# Patient Record
Sex: Female | Born: 1970 | Race: White | Hispanic: No | Smoking: Former smoker
Health system: Southern US, Community
[De-identification: ages and names within clinical notes are randomized; demographics above are authoritative.]

## PROBLEM LIST (undated history)

## (undated) DIAGNOSIS — I959 Hypotension, unspecified: Secondary | ICD-10-CM

## (undated) DIAGNOSIS — J189 Pneumonia, unspecified organism: Secondary | ICD-10-CM

## (undated) DIAGNOSIS — F32A Depression, unspecified: Secondary | ICD-10-CM

## (undated) DIAGNOSIS — F329 Major depressive disorder, single episode, unspecified: Secondary | ICD-10-CM

## (undated) DIAGNOSIS — F4310 Post-traumatic stress disorder, unspecified: Secondary | ICD-10-CM

## (undated) DIAGNOSIS — I2699 Other pulmonary embolism without acute cor pulmonale: Secondary | ICD-10-CM

## (undated) DIAGNOSIS — F419 Anxiety disorder, unspecified: Secondary | ICD-10-CM

## (undated) DIAGNOSIS — E039 Hypothyroidism, unspecified: Secondary | ICD-10-CM

## (undated) DIAGNOSIS — A0472 Enterocolitis due to Clostridium difficile, not specified as recurrent: Secondary | ICD-10-CM

## (undated) DIAGNOSIS — M1990 Unspecified osteoarthritis, unspecified site: Secondary | ICD-10-CM

## (undated) DIAGNOSIS — I219 Acute myocardial infarction, unspecified: Secondary | ICD-10-CM

## (undated) HISTORY — DX: Depression, unspecified: F32.A

## (undated) HISTORY — DX: Unspecified osteoarthritis, unspecified site: M19.90

## (undated) HISTORY — PX: NASAL SINUS SURGERY: SHX719

## (undated) HISTORY — PX: CARPAL TUNNEL RELEASE: SHX101

## (undated) HISTORY — DX: Anxiety disorder, unspecified: F41.9

## (undated) HISTORY — PX: TUBAL LIGATION: SHX77

## (undated) HISTORY — PX: WISDOM TOOTH EXTRACTION: SHX21

## (undated) HISTORY — DX: Major depressive disorder, single episode, unspecified: F32.9

---

## 2012-08-18 ENCOUNTER — Ambulatory Visit (INDEPENDENT_AMBULATORY_CARE_PROVIDER_SITE_OTHER): Payer: PRIVATE HEALTH INSURANCE | Admitting: Psychiatry

## 2012-08-18 ENCOUNTER — Encounter (HOSPITAL_COMMUNITY): Payer: Self-pay | Admitting: Psychiatry

## 2012-08-18 VITALS — BP 116/76 | HR 92 | Ht 63.0 in | Wt 186.0 lb

## 2012-08-18 DIAGNOSIS — F3110 Bipolar disorder, current episode manic without psychotic features, unspecified: Secondary | ICD-10-CM

## 2012-08-18 DIAGNOSIS — F319 Bipolar disorder, unspecified: Secondary | ICD-10-CM

## 2012-08-18 MED ORDER — SERTRALINE HCL 25 MG PO TABS: ORAL_TABLET | ORAL | Status: DC
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
## 2012-08-22 DIAGNOSIS — F3110 Bipolar disorder, current episode manic without psychotic features, unspecified: Secondary | ICD-10-CM | POA: Insufficient documentation

## 2012-08-24 ENCOUNTER — Telehealth (HOSPITAL_COMMUNITY): Payer: Self-pay | Admitting: Psychiatry

## 2012-08-24 NOTE — Telephone Encounter (Signed)
Patient filled sertraline. Called and left message as patient had not filled clonazepam or seroquel from this pharmacy.

## 2012-09-15 ENCOUNTER — Ambulatory Visit (HOSPITAL_COMMUNITY): Payer: PRIVATE HEALTH INSURANCE | Admitting: Psychiatry

## 2014-02-20 ENCOUNTER — Ambulatory Visit (INDEPENDENT_AMBULATORY_CARE_PROVIDER_SITE_OTHER): Payer: Self-pay | Admitting: Emergency Medicine

## 2014-02-20 ENCOUNTER — Ambulatory Visit (INDEPENDENT_AMBULATORY_CARE_PROVIDER_SITE_OTHER): Payer: Self-pay

## 2014-02-20 VITALS — BP 100/76 | HR 116 | Temp 97.7°F | Resp 18 | Ht 63.0 in | Wt 191.6 lb

## 2014-02-20 DIAGNOSIS — S8000XA Contusion of unspecified knee, initial encounter: Secondary | ICD-10-CM

## 2014-02-20 DIAGNOSIS — M25561 Pain in right knee: Secondary | ICD-10-CM

## 2014-02-20 DIAGNOSIS — M25569 Pain in unspecified knee: Secondary | ICD-10-CM

## 2014-02-20 DIAGNOSIS — S8001XA Contusion of right knee, initial encounter: Secondary | ICD-10-CM

## 2014-02-20 NOTE — Patient Instructions (Signed)
Knee, Cartilage and its Function The menisci are made of tough cartilage, and fit between the surfaces of the bones upon which they rest. The menisci are semi lunar (C-shaped) and have a wedged profile. The wedged profile helps the stability of the joint by keeping the rounded femur surface from sliding off the flat tibial surface. The menisci are nourished (fed) by small blood vessels; but there is also a large area in the center of the meniscus that does not have a good blood supply (avascular). This presents a problem when there is an injury to the meniscus, as areas without good blood supply heal poorly. When there is a torn cartilage in the knee, surgery is often required to fix this. This is usually done with an arthroscopy (a surgical procedure less invasive than open surgery). Some times open surgery of the knee is required if there is other damage which has occurred. The medial meniscus rests on the medial tibial plateau. The tibia is the large bone in your lower leg (the shin bone). The medial tibial plateau is the upper end of the bone making up the inner part of your knee. The lateral meniscus serves the same purpose and is located on the outside of the knee. The menisci help to distribute your body weight across the knee joint. Without the meniscus present, the weight of your body would be unevenly applied to the bones in your legs (the femur and tibia). The femur is the large bone in your thigh. This uneven weight distribution would cause increased wear and tear on the cartilage covering the bones, leading to early damage of these areas. The presence of the menisci cartilage is necessary for a healthy knee. PURPOSE OF THE KNEE CARTILAGE The knee joint is made up of three bones: the thigh bone (femur), the shin bone (tibia), and the knee cap (patella). The surfaces of these bones at the knee joint are covered with cartilage. This smooth, slippery surface allows the bones to slide against each other  without causing bone damage. The meniscus sits between these cartilaginous surfaces of the bones (femur and tibia). It distributes the weight evenly in the joints and helps with the stability of the joint (keeps the joint steady). HOME CARE INSTRUCTIONS After surgery:  Use crutches as instructed.  Once home, an ice pack applied to your injury may help with discomfort and keep the swelling down. An ice pack can be used for 15-20 minutes 03-04 times per day for the first 2-3 days or as instructed.  Only take over-the-counter or prescription medicines for pain, discomfort, or fever as directed by your caregiver.  Call if you do not have relief of pain with medications or if there is an increase in pain.  Call if your foot becomes cold or blue.  Call if your knee gets stuck in a fixed position and will not move.  You may resume normal diet and activities as directed.  Make sure to keep your appointment with your follow-up caregiver. This injury may require further evaluation and treatment beyond the treatment you received today. MAKE SURE YOU:   Understand these instructions.  Will watch your condition.  Will get help right away if you are not doing well or get worse. Document Released: 02/08/2002 Document Revised: 11/11/2011 Document Reviewed: 06/21/2009 ExitCare Patient Information 2015 [employer]. This information is not intended to replace advice given to you by your health care Krithi Bray. Make sure you discuss any questions you have with your health care Dane Bloch.  
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
## 2014-04-24 ENCOUNTER — Ambulatory Visit (INDEPENDENT_AMBULATORY_CARE_PROVIDER_SITE_OTHER): Payer: Self-pay | Admitting: Family Medicine

## 2014-04-24 VITALS — BP 100/78 | HR 90 | Temp 97.7°F | Resp 16 | Ht 63.0 in | Wt 186.2 lb

## 2014-04-24 DIAGNOSIS — L600 Ingrowing nail: Secondary | ICD-10-CM

## 2014-04-24 MED ORDER — FLUCONAZOLE 150 MG PO TABS: 150.0000 mg | ORAL_TABLET | Freq: Once | ORAL | Status: DC

## 2014-04-24 MED ORDER — CEPHALEXIN 500 MG PO CAPS: 500.0000 mg | ORAL_CAPSULE | Freq: Three times a day (TID) | ORAL | Status: DC
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
## 2014-04-24 NOTE — Patient Instructions (Signed)
You are on the right track with your toenail- continue to soak it at night, and pull the skin back from the edge to help it grow up and free (and not down into the skin).  Use the antibiotic three times a day for one week, and the diflucan if needed for yeast infection. If your toe is not better in a week or so please come in and we can remove part of the nail if needed

## 2014-04-30 ENCOUNTER — Ambulatory Visit (INDEPENDENT_AMBULATORY_CARE_PROVIDER_SITE_OTHER): Payer: Self-pay | Admitting: Family Medicine

## 2014-04-30 VITALS — BP 132/80 | HR 87 | Temp 97.7°F | Resp 15 | Ht 63.0 in | Wt 184.6 lb

## 2014-04-30 DIAGNOSIS — M79609 Pain in unspecified limb: Secondary | ICD-10-CM

## 2014-04-30 DIAGNOSIS — L600 Ingrowing nail: Secondary | ICD-10-CM

## 2014-04-30 MED ORDER — TRAMADOL HCL 50 MG PO TABS
50.0000 mg | ORAL_TABLET | Freq: Four times a day (QID) | ORAL | Status: DC | PRN
Start: 2014-04-30 — End: 2020-12-03

## 2014-04-30 NOTE — Addendum Note (Signed)
Addended by: Johnnette Litter on: 04/30/2014 04:07 PM   Modules accepted: Orders

## 2014-04-30 NOTE — Progress Notes (Signed)
Procedure:  Consent obtained.  Digital block with lido and marcaine.  Local anesthesia obtained.  Nail edge lifted and then nail splitters used to remove the medial portion on the patient's right great toenail. Xeroform gauze placed and then a drsg.  Wound care d/w pt.

## 2014-04-30 NOTE — Patient Instructions (Signed)
INGROWN TOENAIL . Keep area clean, dry and bandaged for 24 hours. . After 24 hours, remove outer bandage and leave yellow gauze in place. . Soak toe/foot in warm soapy water for 5-10 minutes, once daily for 5 days. Rebandage toe after each cleaning. . Continue soaks until yellow gauze falls off. . Notify the office if you experience any of the following signs of infection: Swelling, redness, pus drainage, streaking, fever > 101.0 F   
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
## 2015-02-09 ENCOUNTER — Other Ambulatory Visit: Payer: Self-pay | Admitting: Family Medicine

## 2015-09-03 HISTORY — PX: KNEE ARTHROPLASTY: SHX992

## 2018-03-06 ENCOUNTER — Other Ambulatory Visit: Payer: Self-pay

## 2018-03-06 ENCOUNTER — Emergency Department (HOSPITAL_COMMUNITY): Payer: Medicaid Other

## 2018-03-06 ENCOUNTER — Encounter (HOSPITAL_COMMUNITY): Payer: Self-pay | Admitting: *Deleted

## 2018-03-06 ENCOUNTER — Observation Stay (HOSPITAL_BASED_OUTPATIENT_CLINIC_OR_DEPARTMENT_OTHER): Payer: Medicaid Other

## 2018-03-06 ENCOUNTER — Observation Stay (HOSPITAL_COMMUNITY)
Admission: EM | Admit: 2018-03-06 | Discharge: 2018-03-07 | Disposition: A | Discharge status: Home / Self Care | Payer: Medicaid Other | Attending: Internal Medicine | Admitting: Internal Medicine

## 2018-03-06 DIAGNOSIS — E039 Hypothyroidism, unspecified: Secondary | ICD-10-CM | POA: Diagnosis present

## 2018-03-06 DIAGNOSIS — I2510 Atherosclerotic heart disease of native coronary artery without angina pectoris: Secondary | ICD-10-CM | POA: Diagnosis not present

## 2018-03-06 DIAGNOSIS — Z87891 Personal history of nicotine dependence: Secondary | ICD-10-CM | POA: Diagnosis not present

## 2018-03-06 DIAGNOSIS — I340 Nonrheumatic mitral (valve) insufficiency: Secondary | ICD-10-CM

## 2018-03-06 DIAGNOSIS — R109 Unspecified abdominal pain: Secondary | ICD-10-CM | POA: Diagnosis present

## 2018-03-06 DIAGNOSIS — R079 Chest pain, unspecified: Principal | ICD-10-CM | POA: Insufficient documentation

## 2018-03-06 DIAGNOSIS — Z789 Other specified health status: Secondary | ICD-10-CM

## 2018-03-06 DIAGNOSIS — R1012 Left upper quadrant pain: Secondary | ICD-10-CM | POA: Diagnosis not present

## 2018-03-06 DIAGNOSIS — F418 Other specified anxiety disorders: Secondary | ICD-10-CM | POA: Diagnosis present

## 2018-03-06 DIAGNOSIS — F319 Bipolar disorder, unspecified: Secondary | ICD-10-CM | POA: Diagnosis not present

## 2018-03-06 DIAGNOSIS — Z79899 Other long term (current) drug therapy: Secondary | ICD-10-CM | POA: Insufficient documentation

## 2018-03-06 DIAGNOSIS — Z7289 Other problems related to lifestyle: Secondary | ICD-10-CM | POA: Diagnosis present

## 2018-03-06 DIAGNOSIS — F329 Major depressive disorder, single episode, unspecified: Secondary | ICD-10-CM | POA: Insufficient documentation

## 2018-03-06 DIAGNOSIS — F419 Anxiety disorder, unspecified: Secondary | ICD-10-CM | POA: Insufficient documentation

## 2018-03-06 DIAGNOSIS — E785 Hyperlipidemia, unspecified: Secondary | ICD-10-CM | POA: Diagnosis present

## 2018-03-06 DIAGNOSIS — Z7902 Long term (current) use of antithrombotics/antiplatelets: Secondary | ICD-10-CM | POA: Insufficient documentation

## 2018-03-06 DIAGNOSIS — E876 Hypokalemia: Secondary | ICD-10-CM | POA: Diagnosis present

## 2018-03-06 LAB — BASIC METABOLIC PANEL
ANION GAP: 14 (ref 5–15)
BUN: 9 mg/dL (ref 6–20)
CALCIUM: 9.3 mg/dL (ref 8.9–10.3)
CO2: 19 mmol/L — ABNORMAL LOW (ref 22–32)
Chloride: 106 mmol/L (ref 98–111)
Creatinine, Ser: 0.81 mg/dL (ref 0.44–1.00)
GFR calc Af Amer: >60 mL/min (ref >60)
GLUCOSE: 117 mg/dL — AB (ref 70–99)
Potassium: 3 mmol/L — ABNORMAL LOW (ref 3.5–5.1)
SODIUM: 139 mmol/L (ref 135–145)

## 2018-03-06 LAB — CBC
HCT: 38.8 % (ref 36.0–46.0)
HEMOGLOBIN: 12.5 g/dL (ref 12.0–15.0)
MCH: 29.8 pg (ref 26.0–34.0)
MCHC: 32.2 g/dL (ref 30.0–36.0)
MCV: 92.4 fL (ref 78.0–100.0)
Platelets: 348 10*3/uL (ref 150–400)
RBC: 4.2 MIL/uL (ref 3.87–5.11)
RDW: 12.9 % (ref 11.5–15.5)
WBC: 7.6 10*3/uL (ref 4.0–10.5)

## 2018-03-06 LAB — I-STAT BETA HCG BLOOD, ED (MC, WL, AP ONLY): I-stat hCG, quantitative: <5 m[IU]/mL (ref <5)

## 2018-03-06 LAB — TROPONIN I
Troponin I: <0.03 ng/mL (ref <0.03)
Troponin I: <0.03 ng/mL (ref <0.03)

## 2018-03-06 LAB — RAPID URINE DRUG SCREEN, HOSP PERFORMED
AMPHETAMINES: NOT DETECTED
Benzodiazepines: NOT DETECTED
Cocaine: NOT DETECTED
Opiates: POSITIVE — AB
TETRAHYDROCANNABINOL: NOT DETECTED

## 2018-03-06 LAB — HIV ANTIBODY (ROUTINE TESTING W REFLEX): HIV SCREEN 4TH GENERATION: NONREACTIVE

## 2018-03-06 LAB — TSH: TSH: 1.983 u[IU]/mL (ref 0.350–4.500)

## 2018-03-06 LAB — ECHOCARDIOGRAM COMPLETE
Height: 62 in
WEIGHTICAEL: 3014.4 [oz_av]

## 2018-03-06 LAB — I-STAT TROPONIN, ED: TROPONIN I, POC: 0 ng/mL (ref 0.00–0.08)

## 2018-03-06 LAB — LIPASE, BLOOD: Lipase: 42 U/L (ref 11–51)

## 2018-03-06 LAB — MAGNESIUM: Magnesium: 1.8 mg/dL (ref 1.7–2.4)

## 2018-03-06 MED ORDER — POTASSIUM CHLORIDE CRYS ER 20 MEQ PO TBCR
40.0000 meq | EXTENDED_RELEASE_TABLET | Freq: Once | ORAL | Status: AC
  Administered 2018-03-06: 40 meq via ORAL
  Filled 2018-03-06: qty 2

## 2018-03-06 MED ORDER — SODIUM CHLORIDE 0.9 % IV BOLUS
1000.0000 mL | Freq: Once | INTRAVENOUS | Status: AC
  Administered 2018-03-06: 1000 mL via INTRAVENOUS

## 2018-03-06 MED ORDER — LEVOTHYROXINE SODIUM 125 MCG PO TABS
125.0000 ug | ORAL_TABLET | Freq: Every day | ORAL | Status: DC
  Filled 2018-03-06 (×2): qty 1

## 2018-03-06 MED ORDER — QUETIAPINE FUMARATE 100 MG PO TABS
100.0000 mg | ORAL_TABLET | Freq: Every day | ORAL | Status: DC
  Administered 2018-03-06: 100 mg via ORAL
  Filled 2018-03-06: qty 2
  Filled 2018-03-06 (×2): qty 1

## 2018-03-06 MED ORDER — METAXALONE 800 MG PO TABS
800.0000 mg | ORAL_TABLET | Freq: Two times a day (BID) | ORAL | Status: DC
  Administered 2018-03-06 – 2018-03-07 (×3): 800 mg via ORAL
  Filled 2018-03-06 (×4): qty 1

## 2018-03-06 MED ORDER — MORPHINE SULFATE (PF) 4 MG/ML IV SOLN: 3.0000 mg | INTRAVENOUS | Status: DC | PRN

## 2018-03-06 MED ORDER — ALUM & MAG HYDROXIDE-SIMETH 200-200-20 MG/5ML PO SUSP: 30.0000 mL | Freq: Four times a day (QID) | ORAL | Status: DC | PRN

## 2018-03-06 MED ORDER — IOPAMIDOL (ISOVUE-370) INJECTION 76%
INTRAVENOUS | Status: AC
  Filled 2018-03-06: qty 100

## 2018-03-06 MED ORDER — ACETAMINOPHEN 325 MG PO TABS: 650.0000 mg | ORAL_TABLET | ORAL | Status: DC | PRN

## 2018-03-06 MED ORDER — LORAZEPAM 2 MG/ML IJ SOLN: 0.0000 mg | Freq: Four times a day (QID) | INTRAMUSCULAR | Status: DC

## 2018-03-06 MED ORDER — MORPHINE SULFATE (PF) 4 MG/ML IV SOLN
4.0000 mg | Freq: Once | INTRAVENOUS | Status: AC
  Administered 2018-03-06: 4 mg via INTRAVENOUS
  Filled 2018-03-06: qty 1

## 2018-03-06 MED ORDER — ENOXAPARIN SODIUM 40 MG/0.4ML ~~LOC~~ SOLN
40.0000 mg | SUBCUTANEOUS | Status: DC
  Administered 2018-03-06 – 2018-03-07 (×2): 40 mg via SUBCUTANEOUS
  Filled 2018-03-06 (×2): qty 0.4

## 2018-03-06 MED ORDER — CLOPIDOGREL BISULFATE 75 MG PO TABS
75.0000 mg | ORAL_TABLET | Freq: Every day | ORAL | Status: DC
  Administered 2018-03-06: 75 mg via ORAL
  Filled 2018-03-06 (×2): qty 1

## 2018-03-06 MED ORDER — LORAZEPAM 1 MG PO TABS
1.0000 mg | ORAL_TABLET | Freq: Four times a day (QID) | ORAL | Status: DC | PRN
Start: 2018-03-06 — End: 2018-03-07

## 2018-03-06 MED ORDER — ASPIRIN EC 81 MG PO TBEC
81.0000 mg | DELAYED_RELEASE_TABLET | Freq: Every day | ORAL | Status: DC
  Administered 2018-03-06 – 2018-03-07 (×2): 81 mg via ORAL
  Filled 2018-03-06 (×2): qty 1

## 2018-03-06 MED ORDER — NITROGLYCERIN 0.4 MG SL SUBL: 0.4000 mg | SUBLINGUAL_TABLET | SUBLINGUAL | Status: DC | PRN

## 2018-03-06 MED ORDER — LORAZEPAM 2 MG/ML IJ SOLN: 1.0000 mg | Freq: Four times a day (QID) | INTRAMUSCULAR | Status: DC | PRN

## 2018-03-06 MED ORDER — ONDANSETRON HCL 4 MG/2ML IJ SOLN: 4.0000 mg | Freq: Four times a day (QID) | INTRAMUSCULAR | Status: DC | PRN

## 2018-03-06 MED ORDER — IOPAMIDOL (ISOVUE-370) INJECTION 76%
100.0000 mL | Freq: Once | INTRAVENOUS | Status: AC | PRN
  Administered 2018-03-06: 100 mL via INTRAVENOUS

## 2018-03-06 MED ORDER — ASPIRIN EC 325 MG PO TBEC: 325.0000 mg | DELAYED_RELEASE_TABLET | Freq: Every day | ORAL | Status: DC

## 2018-03-06 MED ORDER — FENOFIBRATE 160 MG PO TABS
160.0000 mg | ORAL_TABLET | Freq: Every day | ORAL | Status: DC
  Administered 2018-03-06: 160 mg via ORAL
  Filled 2018-03-06 (×2): qty 1

## 2018-03-06 MED ORDER — THIAMINE HCL 100 MG/ML IJ SOLN: 100.0000 mg | Freq: Every day | INTRAMUSCULAR | Status: DC

## 2018-03-06 MED ORDER — LORAZEPAM 2 MG/ML IJ SOLN: 0.0000 mg | Freq: Two times a day (BID) | INTRAMUSCULAR | Status: DC

## 2018-03-06 MED ORDER — FAMOTIDINE IN NACL 20-0.9 MG/50ML-% IV SOLN
20.0000 mg | Freq: Two times a day (BID) | INTRAVENOUS | Status: DC
  Filled 2018-03-06: qty 50

## 2018-03-06 MED ORDER — PRAVASTATIN SODIUM 40 MG PO TABS
40.0000 mg | ORAL_TABLET | Freq: Every day | ORAL | Status: DC
  Administered 2018-03-06: 40 mg via ORAL
  Filled 2018-03-06 (×2): qty 1

## 2018-03-06 MED ORDER — ADULT MULTIVITAMIN W/MINERALS CH
1.0000 | ORAL_TABLET | Freq: Every day | ORAL | Status: DC
  Administered 2018-03-06 – 2018-03-07 (×2): 1 via ORAL
  Filled 2018-03-06 (×2): qty 1

## 2018-03-06 MED ORDER — SODIUM CHLORIDE 0.9 % IV SOLN
INTRAVENOUS | Status: DC
  Administered 2018-03-06: 125 mL/h via INTRAVENOUS

## 2018-03-06 MED ORDER — FOLIC ACID 1 MG PO TABS
1.0000 mg | ORAL_TABLET | Freq: Every day | ORAL | Status: DC
  Administered 2018-03-06 – 2018-03-07 (×2): 1 mg via ORAL
  Filled 2018-03-06 (×2): qty 1

## 2018-03-06 MED ORDER — CLONAZEPAM 0.5 MG PO TABS
1.0000 mg | ORAL_TABLET | Freq: Two times a day (BID) | ORAL | Status: DC | PRN
  Administered 2018-03-06 – 2018-03-07 (×3): 1 mg via ORAL
  Filled 2018-03-06 (×3): qty 2

## 2018-03-06 MED ORDER — ZOLPIDEM TARTRATE 5 MG PO TABS: 5.0000 mg | ORAL_TABLET | Freq: Every evening | ORAL | Status: DC | PRN

## 2018-03-06 MED ORDER — VITAMIN B-1 100 MG PO TABS
100.0000 mg | ORAL_TABLET | Freq: Every day | ORAL | Status: DC
  Administered 2018-03-06 – 2018-03-07 (×2): 100 mg via ORAL
  Filled 2018-03-06 (×2): qty 1

## 2018-03-06 MED ORDER — TRAMADOL HCL 50 MG PO TABS: 50.0000 mg | ORAL_TABLET | Freq: Four times a day (QID) | ORAL | Status: DC | PRN

## 2018-03-06 MED ORDER — PANTOPRAZOLE SODIUM 40 MG PO TBEC
40.0000 mg | DELAYED_RELEASE_TABLET | Freq: Every day | ORAL | Status: DC
  Administered 2018-03-06: 40 mg via ORAL
  Filled 2018-03-06: qty 1

## 2018-03-06 NOTE — ED Notes (Signed)
MD Niu at bedside  

## 2018-03-06 NOTE — ED Triage Notes (Signed)
From home, awoke with reported 8.5/10 chest pain  Took nitro prior to [facility] arrival, attempted to chew asprin PTA but reported "choking" and vomitting and throat pain since attempting to chew asprin. SOB, abdominal tenderness  5/10 CP on arrival 146/84 initally, took additional nitro,  HR 110, 97% on 2L Hx of MI in 12/2015; 1 stents placed  18 in LAC 150-200 CC bolus enroute  Alert oriented x4
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
## 2018-03-06 NOTE — Progress Notes (Signed)
PROGRESS NOTE    Melanie Dudley  ZHY:[id number] DOB: 02/17/1971 DOA: 03/06/2018 PCP: Patient, No Pcp Per      Brief Narrative:  Melanie Dudley is a 47 y.o. F with anxiety, history CAD with RCA STEMI in 2017, hypothyroidism, anxiety and bipolar, and alcohol use who presents with chest pain.  The patient was in her usual state of health until around 1 AM the night of admission when she was lying in bed, noticed difficulty breathing.  As she got up she noticed chest pain, so she called 9-1-1 who told her to take ASA, which caused her to vomit, after which she had severe chest pressure, dyspnea, and anxiety, and was transported to the ER.   There initial ECG and CXR normal. CTA negative. Troponin normal.     Assessment & Plan:  Chest pain HEART score 4. -Cycle enzymes -Obtain echo -Monitor on telemetry -Nuclear treadmill stress tomorrow -NPO in AM  Coroanry artery disease secondary prevention -Continue home aspirin, Plavix, fibrate, statin  Depression and anxiety -Continue home Klonopin, Seroquel  Hypothyroidism -Continue levothyroxine  Hypokalemia Potassium given -Repeat BMP tomorrow  Abdominal pain Lipase normal.  Likely gastritis from alcohol -Continue Protonix, Mylanta, Pepcid  Alcohol use -CIWA with lorazepam on demand -Cessation recommended    DVT prophylaxis: Lovenox Code Status: FULL Family Communication: Husband at bedside MDM and disposition Plan: This is a no charge note.  For further details, please see H&P by my partner Dr. Clyde LundborgNiu from earlier today.  The below labs and imaging reports were reviewed and summarized above.    The patient was admitted with chest pain.  High risk, moderately suspicious features, ECG unremarkable.  Cycles enzymes and stress tomorrow if normal.    Objective: Vitals:   03/06/18 0500 03/06/18 0530 03/06/18 0609 03/06/18 1450  BP: 104/80 104/71 101/72 105/73  Pulse: 81 75 75 74  Resp: 15 10    Temp:   98.1 F (36.7 C) 97.7 F (36.5  C)  TempSrc:   Oral Oral  SpO2: 98% 92% 100% 96%  Weight:   85.5 kg (188 lb 6.4 oz)   Height:   5\' 2"  (1.575 m)     Intake/Output Summary (Last 24 hours) at 03/06/2018 1701 Last data filed at 03/06/2018 0655 Gross per 24 hour  Intake 2538.47 ml  Output -  Net 2538.47 ml   Filed Weights   03/06/18 0157 03/06/18 0609  Weight: 81.6 kg (180 lb) 85.5 kg (188 lb 6.4 oz)    Examination: The patient was seen and examined.      Data Reviewed: I have personally reviewed following labs and imaging studies:  CBC: Recent Labs  Lab 03/06/18 0200  WBC 7.6  HGB 12.5  HCT 38.8  MCV 92.4  PLT 348   Basic Metabolic Panel: Recent Labs  Lab 03/06/18 0200 03/06/18 0523  NA 139  --   K 3.0*  --   CL 106  --   CO2 19*  --   GLUCOSE 117*  --   BUN 9  --   CREATININE 0.81  --   CALCIUM 9.3  --   MG  --  1.8   GFR: Estimated Creatinine Clearance: 85.3 mL/min (by C-G formula based on SCr of 0.81 mg/dL). Liver Function Tests: No results for input(s): AST, ALT, ALKPHOS, BILITOT, PROT, ALBUMIN in the last 168 hours. Recent Labs  Lab 03/06/18 0523  LIPASE 42   No results for input(s): AMMONIA in the last 168 hours. Coagulation Profile: No results  for input(s): INR, PROTIME in the last 168 hours. Cardiac Enzymes: Recent Labs  Lab 03/06/18 0523 03/06/18 1053  TROPONINI <0.03 <0.03   BNP (last 3 results) No results for input(s): PROBNP in the last 8760 hours. HbA1C: No results for input(s): HGBA1C in the last 72 hours. CBG: No results for input(s): GLUCAP in the last 168 hours. Lipid Profile: No results for input(s): CHOL, HDL, LDLCALC, TRIG, CHOLHDL, LDLDIRECT in the last 72 hours. Thyroid Function Tests: Recent Labs    03/06/18 1053  TSH 1.983   Anemia Panel: No results for input(s): VITAMINB12, FOLATE, FERRITIN, TIBC, IRON, RETICCTPCT in the last 72 hours. Urine analysis: No results found for: COLORURINE, APPEARANCEUR, LABSPEC, PHURINE, GLUCOSEU, HGBUR,  BILIRUBINUR, KETONESUR, PROTEINUR, UROBILINOGEN, NITRITE, LEUKOCYTESUR Sepsis Labs: @LABRCNTIP (procalcitonin:4,lacticacidven:4)  )No results found for this or any previous visit (from the past 240 hour(s)).       Radiology Studies: Dg Chest 2 View  Result Date: 03/06/2018 CLINICAL DATA:  Patient woke with chest pain. Checking, vomiting, and throat pain after attempting to chew on aspirin. Shortness of breath and abdominal tenderness. EXAM: CHEST - 2 VIEW COMPARISON:  None. FINDINGS: The heart size and mediastinal contours are within normal limits. Both lungs are clear. The visualized skeletal structures are unremarkable. IMPRESSION: No active cardiopulmonary disease. Electronically Signed   By: Burman Nieves M.D.   On: 03/06/2018 02:30   Ct Angio Chest/abd/pel For Dissection W And/or Wo Contrast  Result Date: 03/06/2018 CLINICAL DATA:  47 y/o F; chest pain, shortness of breath, abdominal tension. Evaluate for aortic dissection. EXAM: CT ANGIOGRAPHY CHEST, ABDOMEN AND PELVIS TECHNIQUE: Multidetector CT imaging through the chest, abdomen and pelvis was performed using the standard protocol during bolus administration of intravenous contrast. Multiplanar reconstructed images and MIPs were obtained and reviewed to evaluate the vascular anatomy. CONTRAST:  ISOVUE-370 IOPAMIDOL (ISOVUE-370) INJECTION 76% COMPARISON:  None. FINDINGS: CTA CHEST FINDINGS Cardiovascular: Normal heart size. No pericardial effusion. Moderate coronary artery calcification. Normal caliber thoracic aorta and main pulmonary artery. Mediastinum/Nodes: No enlarged mediastinal, hilar, or axillary lymph nodes. Thyroid gland, trachea, and esophagus demonstrate no significant findings. Lungs/Pleura: Lungs are clear. No pleural effusion or pneumothorax. Mild centrilobular emphysema of the lungs. Musculoskeletal: No chest wall abnormality. No acute or significant osseous findings. Review of the MIP images confirms the above  findings. CTA ABDOMEN AND PELVIS FINDINGS VASCULAR Aorta: Normal caliber aorta without aneurysm, dissection, vasculitis or significant stenosis. Moderate calcific atherosclerosis. Celiac: Patent without evidence of aneurysm, dissection, vasculitis or significant stenosis. SMA: Patent without evidence of aneurysm, dissection, vasculitis or significant stenosis. Renals: Both renal arteries are patent without evidence of aneurysm, dissection, vasculitis, fibromuscular dysplasia or significant stenosis. IMA: Patent without evidence of aneurysm, dissection, vasculitis or significant stenosis. Inflow: Patent without evidence of aneurysm, dissection, vasculitis or significant stenosis. Veins: No obvious venous abnormality within the limitations of this arterial phase study. Review of the MIP images confirms the above findings. NON-VASCULAR Hepatobiliary: No focal liver abnormality is seen. Cholelithiasis. No gallbladder wall thickening or biliary ductal dilatation. Pancreas: Unremarkable. No pancreatic ductal dilatation or surrounding inflammatory changes. Spleen: Normal in size without focal abnormality. Adrenals/Urinary Tract: Adrenal glands are unremarkable. Kidneys are normal, without renal calculi, focal lesion, or hydronephrosis. Bladder is unremarkable. Stomach/Bowel: Stomach is within normal limits. Appendix appears normal. No evidence of bowel wall thickening, distention, or inflammatory changes. Lymphatic: No significant vascular findings are present. No enlarged abdominal or pelvic lymph nodes. Reproductive: Uterus and bilateral adnexa are unremarkable. Other: No abdominal wall hernia  or abnormality. No abdominopelvic ascites. Musculoskeletal: No acute or significant osseous findings. Review of the MIP images confirms the above findings. IMPRESSION: 1. No acute vascular abnormality. No aortic dissection, stenosis, aneurysm, or findings of vasculitis. 2. Moderate calcific atherosclerosis of coronary arteries and  the aorta. 3. Mild centrilobular emphysema of the lungs. 4. Cholelithiasis.  No secondary signs of cholecystitis. Electronically Signed   By: Mitzi Hansen M.D.   On: 03/06/2018 04:22        Scheduled Meds: . aspirin EC  81 mg Oral Daily  . clopidogrel  75 mg Oral Daily  . enoxaparin (LOVENOX) injection  40 mg Subcutaneous Q24H  . fenofibrate  160 mg Oral Daily  . folic acid  1 mg Oral Daily  . levothyroxine  125 mcg Oral QAC breakfast  . LORazepam  0-4 mg Intravenous Q6H   Followed by  . [START ON 03/08/2018] LORazepam  0-4 mg Intravenous Q12H  . metaxalone  800 mg Oral BID  . multivitamin with minerals  1 tablet Oral Daily  . pantoprazole  40 mg Oral Q1200  . pravastatin  40 mg Oral Daily  . QUEtiapine  100 mg Oral QHS  . thiamine  100 mg Oral Daily   Or  . thiamine  100 mg Intravenous Daily   Continuous Infusions: . famotidine (PEPCID) IV       LOS: 0 days    Time spent: 25 minutes    Alberteen Sam, MD [facility] 03/06/2018, 5:01 PM     Pager [phone] --- please page though AMION:  [email or web address] Password [email or web address] If 7PM-7AM, please contact night-coverage
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
## 2018-03-06 NOTE — Progress Notes (Signed)
Echocardiogram 2D Echocardiogram has been performed.  Pieter PartridgeBrooke S Jannel Lynne 03/06/2018, 9:03 AM

## 2018-03-06 NOTE — ED Notes (Signed)
Report attempted. RN on floor unable to take call.

## 2018-03-06 NOTE — ED Notes (Signed)
recollect blood to run istat test.

## 2018-03-07 ENCOUNTER — Observation Stay (HOSPITAL_BASED_OUTPATIENT_CLINIC_OR_DEPARTMENT_OTHER): Payer: Medicaid Other

## 2018-03-07 DIAGNOSIS — R079 Chest pain, unspecified: Secondary | ICD-10-CM

## 2018-03-07 DIAGNOSIS — F419 Anxiety disorder, unspecified: Secondary | ICD-10-CM | POA: Diagnosis not present

## 2018-03-07 DIAGNOSIS — I25118 Atherosclerotic heart disease of native coronary artery with other forms of angina pectoris: Secondary | ICD-10-CM | POA: Diagnosis not present

## 2018-03-07 DIAGNOSIS — E785 Hyperlipidemia, unspecified: Secondary | ICD-10-CM

## 2018-03-07 DIAGNOSIS — F329 Major depressive disorder, single episode, unspecified: Secondary | ICD-10-CM | POA: Diagnosis not present

## 2018-03-07 DIAGNOSIS — F319 Bipolar disorder, unspecified: Secondary | ICD-10-CM | POA: Diagnosis not present

## 2018-03-07 LAB — HEMOGLOBIN A1C
HEMOGLOBIN A1C: 5.6 % (ref 4.8–5.6)
MEAN PLASMA GLUCOSE: 114.02 mg/dL

## 2018-03-07 LAB — LIPID PANEL
Cholesterol: 215 mg/dL — ABNORMAL HIGH (ref 0–200)
HDL: 35 mg/dL — AB (ref >40)
LDL Cholesterol: UNDETERMINED mg/dL (ref 0–99)
Total CHOL/HDL Ratio: 6.1 RATIO
Triglycerides: 547 mg/dL — ABNORMAL HIGH (ref <150)
VLDL: UNDETERMINED mg/dL (ref 0–40)

## 2018-03-07 LAB — NM MYOCAR MULTI W/SPECT W/WALL MOTION / EF
CHL CUP RESTING HR STRESS: 80 {beats}/min
CSEPED: 8 min
Estimated workload: 10.1 METS
Exercise duration (sec): 14 s
MPHR: 155 {beats}/min
Peak HR: 155 {beats}/min
Percent HR: 90 %

## 2018-03-07 LAB — BASIC METABOLIC PANEL
ANION GAP: 8 (ref 5–15)
BUN: 8 mg/dL (ref 6–20)
CHLORIDE: 107 mmol/L (ref 98–111)
CO2: 22 mmol/L (ref 22–32)
Calcium: 8.6 mg/dL — ABNORMAL LOW (ref 8.9–10.3)
Creatinine, Ser: 0.89 mg/dL (ref 0.44–1.00)
GFR calc non Af Amer: >60 mL/min (ref >60)
GLUCOSE: 113 mg/dL — AB (ref 70–99)
POTASSIUM: 3.6 mmol/L (ref 3.5–5.1)
Sodium: 137 mmol/L (ref 135–145)

## 2018-03-07 MED ORDER — REGADENOSON 0.4 MG/5ML IV SOLN
INTRAVENOUS | Status: AC
  Filled 2018-03-07: qty 5

## 2018-03-07 MED ORDER — TECHNETIUM TC 99M TETROFOSMIN IV KIT
30.0000 | PACK | Freq: Once | INTRAVENOUS | Status: AC | PRN
  Administered 2018-03-07: 30 via INTRAVENOUS

## 2018-03-07 MED ORDER — TECHNETIUM TC 99M TETROFOSMIN IV KIT
10.0000 | PACK | Freq: Once | INTRAVENOUS | Status: AC | PRN
  Administered 2018-03-07: 10 via INTRAVENOUS
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
## 2018-03-07 NOTE — Discharge Instructions (Signed)
Follow with Primary MD  in 7 days   Get CBC, CMP, 2 view Chest X ray checked  by Primary MD next visit.    Activity: As tolerated with Full fall precautions use walker/cane & assistance as needed   Disposition Home    Diet: Heart Healthy ,carb modified , with feeding assistance and aspiration precautions.  For Heart failure patients - Check your Weight same time everyday, if you gain over 2 pounds, or you develop in leg swelling, experience more shortness of breath or chest pain, call your Primary MD immediately. Follow Cardiac Low Salt Diet and 1.5 lit/day fluid restriction.   On your next visit with your primary care physician please Get Medicines reviewed and adjusted.   Please request your Prim.MD to go over all Hospital Tests and Procedure/Radiological results at the follow up, please get all Hospital records sent to your Prim MD by signing hospital release before you go home.   If you experience worsening of your admission symptoms, develop shortness of breath, life threatening emergency, suicidal or homicidal thoughts you must seek medical attention immediately by calling 911 or calling your MD immediately  if symptoms less severe.  You Must read complete instructions/literature along with all the possible adverse reactions/side effects for all the Medicines you take and that have been prescribed to you. Take any new Medicines after you have completely understood and accpet all the possible adverse reactions/side effects.   Do not drive, operating heavy machinery, perform activities at heights, swimming or participation in water activities or provide baby sitting services if your were admitted for syncope or siezures until you have seen by Primary MD or a Neurologist and advised to do so again.  Do not drive when taking Pain medications.    Do not take more than prescribed Pain, Sleep and Anxiety Medications  Special Instructions: If you have smoked or chewed Tobacco  in the  last 2 yrs please stop smoking, stop any regular Alcohol  and or any Recreational drug use.  Wear Seat belts while driving.   Please note  You were cared for by a hospitalist during your hospital stay. If you have any questions about your discharge medications or the care you received while you were in the hospital after you are discharged, you can call the unit and asked to speak with the hospitalist on call if the hospitalist that took care of you is not available. Once you are discharged, your primary care physician will handle any further medical issues. Please note that NO REFILLS for any discharge medications will be authorized once you are discharged, as it is imperative that you return to your primary care physician (or establish a relationship with a primary care physician if you do not have one) for your aftercare needs so that they can reassess your need for medications and monitor your lab values.

## 2018-03-07 NOTE — Progress Notes (Signed)
  Exercise Myoview Exercise stress portion completed. Images are currently pending. Signed,  Tereso NewcomerScott Inez Rosato, PA-C   03/07/2018 10:12 AM
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
## 2018-11-17 ENCOUNTER — Emergency Department (HOSPITAL_COMMUNITY)
Admission: EM | Admit: 2018-11-17 | Discharge: 2018-11-17 | Disposition: A | Discharge status: Other Acute Inpatient Hospital | Payer: No Typology Code available for payment source | Attending: Emergency Medicine | Admitting: Emergency Medicine

## 2018-11-17 ENCOUNTER — Other Ambulatory Visit: Payer: Self-pay

## 2018-11-17 ENCOUNTER — Encounter (HOSPITAL_COMMUNITY): Payer: Self-pay | Admitting: Emergency Medicine

## 2018-11-17 DIAGNOSIS — Z7902 Long term (current) use of antithrombotics/antiplatelets: Secondary | ICD-10-CM | POA: Insufficient documentation

## 2018-11-17 DIAGNOSIS — E039 Hypothyroidism, unspecified: Secondary | ICD-10-CM | POA: Diagnosis not present

## 2018-11-17 DIAGNOSIS — Z79899 Other long term (current) drug therapy: Secondary | ICD-10-CM | POA: Insufficient documentation

## 2018-11-17 DIAGNOSIS — I2510 Atherosclerotic heart disease of native coronary artery without angina pectoris: Secondary | ICD-10-CM | POA: Diagnosis not present

## 2018-11-17 DIAGNOSIS — R040 Epistaxis: Secondary | ICD-10-CM | POA: Diagnosis present

## 2018-11-17 DIAGNOSIS — Z87891 Personal history of nicotine dependence: Secondary | ICD-10-CM | POA: Insufficient documentation

## 2018-11-17 DIAGNOSIS — I9762 Postprocedural hemorrhage of a circulatory system organ or structure following other procedure: Secondary | ICD-10-CM | POA: Insufficient documentation

## 2018-11-17 DIAGNOSIS — Z7982 Long term (current) use of aspirin: Secondary | ICD-10-CM | POA: Insufficient documentation

## 2018-11-17 HISTORY — DX: Other pulmonary embolism without acute cor pulmonale: I26.99

## 2018-11-17 LAB — CBC
HEMATOCRIT: 37.5 % (ref 36.0–46.0)
Hemoglobin: 12.1 g/dL (ref 12.0–15.0)
MCH: 30 pg (ref 26.0–34.0)
MCHC: 32.3 g/dL (ref 30.0–36.0)
MCV: 93.1 fL (ref 80.0–100.0)
Platelets: 337 10*3/uL (ref 150–400)
RBC: 4.03 MIL/uL (ref 3.87–5.11)
RDW: 13.2 % (ref 11.5–15.5)
WBC: 11.8 10*3/uL — AB (ref 4.0–10.5)
nRBC: 0 % (ref 0.0–0.2)

## 2018-11-17 MED ORDER — SODIUM CHLORIDE 0.9 % IV BOLUS
1000.0000 mL | Freq: Once | INTRAVENOUS | Status: AC
Start: 2018-11-17 — End: 2018-11-17
  Administered 2018-11-17: 1000 mL via INTRAVENOUS

## 2018-11-17 MED ORDER — OXYMETAZOLINE HCL 0.05 % NA SOLN
1.0000 | Freq: Once | NASAL | Status: DC
  Filled 2018-11-17: qty 30

## 2018-11-17 MED ORDER — ONDANSETRON HCL 4 MG/2ML IJ SOLN
4.0000 mg | Freq: Once | INTRAMUSCULAR | Status: AC
Start: 2018-11-17 — End: 2018-11-17
  Administered 2018-11-17: 4 mg via INTRAVENOUS
  Filled 2018-11-17: qty 2

## 2018-11-17 MED ORDER — TRANEXAMIC ACID 1000 MG/10ML IV SOLN
500.0000 mg | Freq: Once | INTRAVENOUS | Status: AC
  Administered 2018-11-17: 500 mg via TOPICAL
  Filled 2018-11-17: qty 10

## 2018-11-17 MED ORDER — MORPHINE SULFATE (PF) 4 MG/ML IV SOLN
4.0000 mg | Freq: Once | INTRAVENOUS | Status: AC
  Administered 2018-11-17: 4 mg via INTRAVENOUS
  Filled 2018-11-17: qty 1

## 2018-11-17 MED ORDER — TRANEXAMIC ACID 1000 MG/10ML IV SOLN
500.0000 mg | Freq: Once | INTRAVENOUS | Status: DC
  Filled 2018-11-17: qty 10
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
## 2018-11-17 NOTE — ED Notes (Signed)
Pt continues to ooze from nares when swallowing

## 2018-11-17 NOTE — ED Triage Notes (Signed)
Patient had nasal surgery on 3/9, today she got the splint removed on 3/16 and to use NS for rinsing of nose.  She woke up tasting blood.  She is on plavix for history PEs since knee surgery 2017.

## 2020-12-02 ENCOUNTER — Emergency Department (HOSPITAL_COMMUNITY)
Admission: EM | Admit: 2020-12-02 | Discharge: 2020-12-03 | Discharge status: Against Medical Advice | Payer: 59 | Attending: Emergency Medicine | Admitting: Emergency Medicine

## 2020-12-02 ENCOUNTER — Emergency Department (HOSPITAL_COMMUNITY): Payer: 59

## 2020-12-02 DIAGNOSIS — E039 Hypothyroidism, unspecified: Secondary | ICD-10-CM | POA: Diagnosis not present

## 2020-12-02 DIAGNOSIS — Z7982 Long term (current) use of aspirin: Secondary | ICD-10-CM | POA: Insufficient documentation

## 2020-12-02 DIAGNOSIS — D72829 Elevated white blood cell count, unspecified: Secondary | ICD-10-CM | POA: Diagnosis not present

## 2020-12-02 DIAGNOSIS — R079 Chest pain, unspecified: Secondary | ICD-10-CM

## 2020-12-02 DIAGNOSIS — Z87891 Personal history of nicotine dependence: Secondary | ICD-10-CM | POA: Diagnosis not present

## 2020-12-02 DIAGNOSIS — I2510 Atherosclerotic heart disease of native coronary artery without angina pectoris: Secondary | ICD-10-CM | POA: Diagnosis not present

## 2020-12-02 DIAGNOSIS — R000 Tachycardia, unspecified: Secondary | ICD-10-CM | POA: Diagnosis not present

## 2020-12-02 DIAGNOSIS — Z7901 Long term (current) use of anticoagulants: Secondary | ICD-10-CM | POA: Diagnosis not present

## 2020-12-02 DIAGNOSIS — Z79899 Other long term (current) drug therapy: Secondary | ICD-10-CM | POA: Diagnosis not present

## 2020-12-02 DIAGNOSIS — R0789 Other chest pain: Secondary | ICD-10-CM | POA: Diagnosis not present

## 2020-12-02 LAB — BASIC METABOLIC PANEL
Anion gap: 16 — ABNORMAL HIGH (ref 5–15)
BUN: 15 mg/dL (ref 6–20)
CO2: 17 mmol/L — ABNORMAL LOW (ref 22–32)
Calcium: 9.6 mg/dL (ref 8.9–10.3)
Chloride: 105 mmol/L (ref 98–111)
Creatinine, Ser: 0.83 mg/dL (ref 0.44–1.00)
GFR, Estimated: >60 mL/min (ref >60)
Glucose, Bld: 94 mg/dL (ref 70–99)
Potassium: 5.1 mmol/L (ref 3.5–5.1)
Sodium: 138 mmol/L (ref 135–145)

## 2020-12-02 LAB — CBC WITH DIFFERENTIAL/PLATELET
Abs Immature Granulocytes: 0.04 10*3/uL (ref 0.00–0.07)
Basophils Absolute: 0.1 10*3/uL (ref 0.0–0.1)
Basophils Relative: 1 %
Eosinophils Absolute: 0.5 10*3/uL (ref 0.0–0.5)
Eosinophils Relative: 5 %
HCT: 40.9 % (ref 36.0–46.0)
Hemoglobin: 13.8 g/dL (ref 12.0–15.0)
Immature Granulocytes: 0 %
Lymphocytes Relative: 41 %
Lymphs Abs: 4.7 10*3/uL — ABNORMAL HIGH (ref 0.7–4.0)
MCH: 30.5 pg (ref 26.0–34.0)
MCHC: 33.7 g/dL (ref 30.0–36.0)
MCV: 90.3 fL (ref 80.0–100.0)
Monocytes Absolute: 0.8 10*3/uL (ref 0.1–1.0)
Monocytes Relative: 7 %
Neutro Abs: 5.2 10*3/uL (ref 1.7–7.7)
Neutrophils Relative %: 46 %
Platelets: 322 10*3/uL (ref 150–400)
RBC: 4.53 MIL/uL (ref 3.87–5.11)
RDW: 13.9 % (ref 11.5–15.5)
WBC: 11.3 10*3/uL — ABNORMAL HIGH (ref 4.0–10.5)
nRBC: 0 % (ref 0.0–0.2)

## 2020-12-02 LAB — ETHANOL: Alcohol, Ethyl (B): 197 mg/dL — ABNORMAL HIGH (ref <10)

## 2020-12-02 LAB — TROPONIN I (HIGH SENSITIVITY): Troponin I (High Sensitivity): 3 ng/L (ref <18)

## 2020-12-02 MED ORDER — ALUM & MAG HYDROXIDE-SIMETH 200-200-20 MG/5ML PO SUSP
30.0000 mL | Freq: Once | ORAL | Status: AC
  Administered 2020-12-02: 30 mL via ORAL
  Filled 2020-12-02: qty 30

## 2020-12-02 MED ORDER — SODIUM CHLORIDE 0.9 % IV BOLUS
1000.0000 mL | Freq: Once | INTRAVENOUS | Status: AC
  Administered 2020-12-02: 1000 mL via INTRAVENOUS

## 2020-12-02 MED ORDER — FAMOTIDINE IN NACL 20-0.9 MG/50ML-% IV SOLN
20.0000 mg | Freq: Once | INTRAVENOUS | Status: AC
  Administered 2020-12-02: 20 mg via INTRAVENOUS
  Filled 2020-12-02: qty 50

## 2020-12-02 MED ORDER — MORPHINE SULFATE (PF) 4 MG/ML IV SOLN
4.0000 mg | Freq: Once | INTRAVENOUS | Status: AC
  Administered 2020-12-02: 4 mg via INTRAVENOUS
  Filled 2020-12-02: qty 1

## 2020-12-02 MED ORDER — ONDANSETRON HCL 4 MG/2ML IJ SOLN
4.0000 mg | Freq: Once | INTRAMUSCULAR | Status: AC
  Administered 2020-12-02: 4 mg via INTRAVENOUS
  Filled 2020-12-02: qty 2

## 2020-12-02 MED ORDER — ACETAMINOPHEN 325 MG PO TABS
650.0000 mg | ORAL_TABLET | Freq: Once | ORAL | Status: DC
  Filled 2020-12-02: qty 2
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
## 2020-12-02 NOTE — ED Notes (Addendum)
Pt having burning pain in chest, with multiple episodes of burping. Said she feels like she needs to throw up but cannot. Pt states that she cannot take tylenol because her heart doctor told her not to. MD made aware
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
## 2020-12-03 DIAGNOSIS — R0789 Other chest pain: Secondary | ICD-10-CM | POA: Diagnosis not present

## 2020-12-03 LAB — RAPID URINE DRUG SCREEN, HOSP PERFORMED
Amphetamines: NOT DETECTED
Barbiturates: NOT DETECTED
Benzodiazepines: POSITIVE — AB
Cocaine: NOT DETECTED
Opiates: POSITIVE — AB
Tetrahydrocannabinol: NOT DETECTED

## 2020-12-03 LAB — D-DIMER, QUANTITATIVE: D-Dimer, Quant: 0.28 ug/mL-FEU (ref 0.00–0.50)

## 2020-12-03 LAB — TROPONIN I (HIGH SENSITIVITY)
Troponin I (High Sensitivity): 6 ng/L (ref <18)
Troponin I (High Sensitivity): 6 ng/L (ref <18)

## 2020-12-03 MED ORDER — KETOROLAC TROMETHAMINE 30 MG/ML IJ SOLN
30.0000 mg | Freq: Once | INTRAMUSCULAR | Status: AC
  Administered 2020-12-03: 30 mg via INTRAVENOUS
  Filled 2020-12-03: qty 1

## 2020-12-03 MED ORDER — ALUM & MAG HYDROXIDE-SIMETH 200-200-20 MG/5ML PO SUSP
30.0000 mL | Freq: Once | ORAL | Status: AC
  Administered 2020-12-03: 30 mL via ORAL
  Filled 2020-12-03 (×2): qty 30

## 2020-12-03 MED ORDER — LIDOCAINE VISCOUS HCL 2 % MT SOLN
15.0000 mL | Freq: Once | OROMUCOSAL | Status: AC
  Administered 2020-12-03: 15 mL via ORAL
  Filled 2020-12-03: qty 15

## 2020-12-03 NOTE — ED Notes (Signed)
Per Dr. Manus Gunning pt is not AMA. Does not need to sign AMA papers?

## 2020-12-03 NOTE — ED Notes (Signed)
Pt refuse DC vitals 
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
## 2020-12-03 NOTE — ED Notes (Signed)
Patient verbalizes understanding of discharge instructions. Opportunity for questioning and answers were provided. Armband removed by staff, pt discharged from ED ambulatory.   

## 2020-12-03 NOTE — Discharge Instructions (Addendum)
Your testing today showed no evidence of heart attack.  You should follow-up with your cardiologist.  Return to the ED if your chest pain becomes exertional, associated shortness of breath, nausea, vomiting, sweating, other concerns

## 2021-08-17 IMAGING — DX DG CHEST 1V PORT
1 series · 1 of 1 positions shown · non-contrast
Comparison: 03/06/2018

CLINICAL DATA: Pain

EXAM:
PORTABLE CHEST 1 VIEW

[chest]
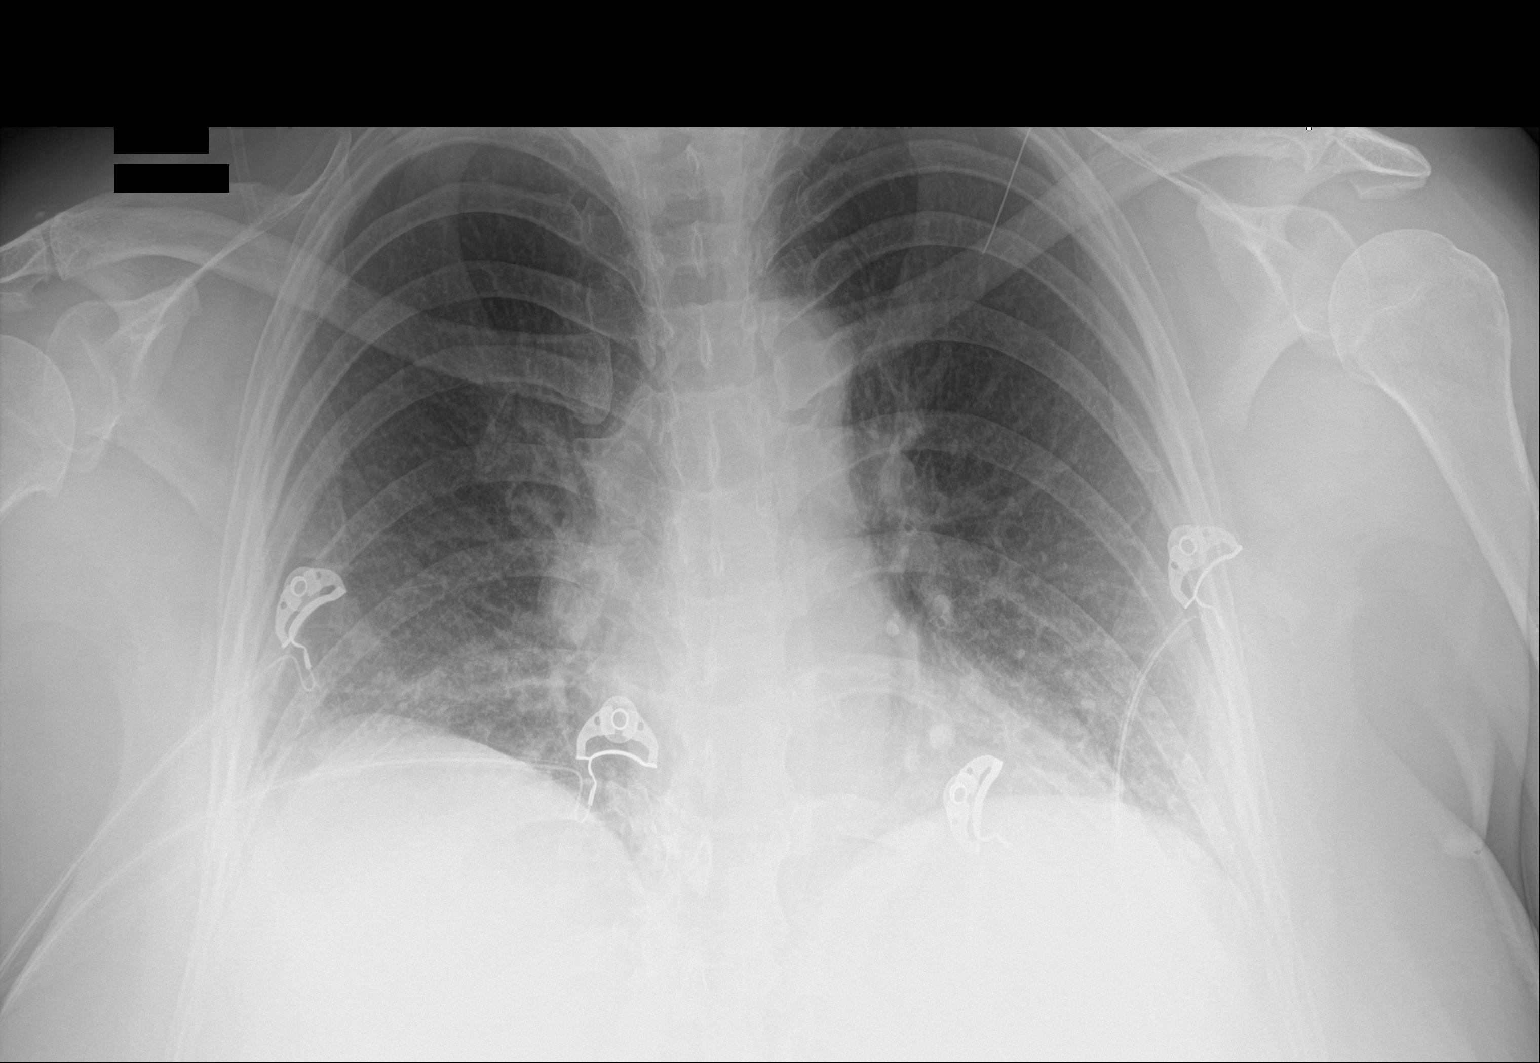

[1 of 1 positions shown; findings below may reference images not displayed]

FINDINGS: Low lung volumes with bibasilar atelectasis. Heart is normal size.
No effusions. No acute bony abnormality.
IMPRESSION: Low lung volumes, bibasilar atelectasis.

## 2022-08-16 ENCOUNTER — Ambulatory Visit: Payer: Self-pay | Admitting: Student

## 2022-09-17 ENCOUNTER — Telehealth (HOSPITAL_COMMUNITY): Payer: Self-pay | Admitting: *Deleted

## 2022-09-17 NOTE — Telephone Encounter (Signed)
Received fax from Dr Rod Can requesting placement of temporary IVC filter by Dr Donzetta Matters prior to right total hip.  Will give to [facility] to schedule.

## 2022-09-20 ENCOUNTER — Other Ambulatory Visit: Payer: Self-pay

## 2022-09-20 DIAGNOSIS — Z86718 Personal history of other venous thrombosis and embolism: Secondary | ICD-10-CM

## 2022-09-20 NOTE — Telephone Encounter (Addendum)
Spoke with Marianna Fuss at Dr. Sid Falcon office. Patient to be scheduled for THA on 11/13/22 and requested IVC filter insertion for 11/04/22 with Dr. Donzetta Matters.   Spoke with patient and scheduled procedure as requested above. Instructions reviewed and patient verbalized understanding. Will mail instructions letter to patient.

## 2022-10-01 ENCOUNTER — Telehealth: Payer: Self-pay

## 2022-10-01 NOTE — Telephone Encounter (Signed)
Pt called with questions regarding her Melanie Dudley and when she could restart it after her procedure.  Reviewed pt's chart, returned call for clarification, two identifiers used. Reviewed the stop dates for the IVCF and instructed her to check with the orthopedic surgeon for restart date. Confirmed understanding.

## 2022-10-04 ENCOUNTER — Telehealth: Payer: Self-pay

## 2022-10-04 NOTE — Telephone Encounter (Signed)
Pt called stating that she needed to have dental work done and her dentist needs an ok from this office before scheduling. Her dentist also needs to know how soon after her hip surgery she can have it done.  Reviewed pt's chart, returned call for clarification, two identifiers used. Pt just had a cleaning and a permanent crown placed yesterday. She has to return for a "buildup" which involves 4 crowns and 2 fillings. Dr. Delfino Lovett told her that she could proceed 6 weeks after surgery, but the dentist needs a timeframe from Dr. Donzetta Matters. Informed her he would be sent a staff msg and she will get a return call next week. Confirmed understanding.

## 2022-10-24 ENCOUNTER — Ambulatory Visit: Payer: Self-pay | Admitting: Student

## 2022-10-24 NOTE — Progress Notes (Signed)
Surgery orders requested via Epic inbox. °

## 2022-10-28 ENCOUNTER — Ambulatory Visit: Payer: Self-pay | Admitting: Student

## 2022-10-30 NOTE — Patient Instructions (Signed)
SURGICAL WAITING ROOM VISITATION  Patients having surgery or a procedure may have no more than 2 support people in the waiting area - these visitors may rotate.    Children under the age of 29 must have an adult with them who is not the patient.  Due to an increase in RSV and influenza rates and associated hospitalizations, children ages 29 and under may not visit patients in [place].  If the patient needs to stay at the hospital during part of their recovery, the visitor guidelines for inpatient rooms apply. Pre-op nurse will coordinate an appropriate time for 1 support person to accompany patient in pre-op.  This support person may not rotate.    Please refer to the [facility] website for the visitor guidelines for Inpatients (after your surgery is over and you are in a regular room).    Your procedure is scheduled on: 11/13/22   Report to [facility] Main Entrance    Report to admitting at 8:30 AM   Call this number if you have problems the morning of surgery [phone]   Do not eat food :After Midnight.   After Midnight you may have the following liquids until 8:00 AM DAY OF SURGERY  Water Non-Citrus Juices (without pulp, NO RED-Apple, White grape, White cranberry) Black Coffee (NO MILK/CREAM OR CREAMERS, sugar ok)  Clear Tea (NO MILK/CREAM OR CREAMERS, sugar ok) regular and decaf                             Plain Jell-O (NO RED)                                           Fruit ices (not with fruit pulp, NO RED)                                     Popsicles (NO RED)                                                               Sports drinks like Gatorade (NO RED)                 The day of surgery:  Drink ONE (1) Pre-Surgery G2 at 8:00 AM the morning of surgery. Drink in one sitting. Do not sip.  This drink was given to you during your hospital  pre-op appointment visit. Nothing else to drink after completing the  Pre-Surgery G2.          If you  have questions, please contact your surgeon's office.   FOLLOW BOWEL PREP AND ANY ADDITIONAL PRE OP INSTRUCTIONS YOU RECEIVED FROM YOUR SURGEON'S OFFICE!!!     Oral Hygiene is also important to reduce your risk of infection.                                    Remember - BRUSH YOUR TEETH THE MORNING OF SURGERY WITH YOUR REGULAR TOOTHPASTE  DENTURES WILL BE REMOVED  PRIOR TO SURGERY PLEASE DO NOT APPLY "Poly grip" OR ADHESIVES!!!   Take these medicines the morning of surgery with A SIP OF WATER: Clonazepam, Levothyroxine   DO NOT TAKE ANY ORAL DIABETIC MEDICATIONS DAY OF YOUR SURGERY  How to Manage Your Diabetes Before and After Surgery  Why is it important to control my blood sugar before and after surgery? Improving blood sugar levels before and after surgery helps healing and can limit problems. A way of improving blood sugar control is eating a healthy diet by:  Eating less sugar and carbohydrates  Increasing activity/exercise  Talking with your doctor about reaching your blood sugar goals High blood sugars (greater than 180 mg/dL) can raise your risk of infections and slow your recovery, so you will need to focus on controlling your diabetes during the weeks before surgery. Make sure that the doctor who takes care of your diabetes knows about your planned surgery including the date and location.  How do I manage my blood sugar before surgery? Check your blood sugar at least 4 times a day, starting 2 days before surgery, to make sure that the level is not too high or low. Check your blood sugar the morning of your surgery when you wake up and every 2 hours until you get to the Short Stay unit. If your blood sugar is less than 70 mg/dL, you will need to treat for low blood sugar: Do not take insulin. Treat a low blood sugar (less than 70 mg/dL) with  cup of clear juice (cranberry or apple), 4 glucose tablets, OR glucose gel. Recheck blood sugar in 15 minutes after treatment (to make  sure it is greater than 70 mg/dL). If your blood sugar is not greater than 70 mg/dL on recheck, call [phone] for further instructions. Report your blood sugar to the short stay nurse when you get to Short Stay.  If you are admitted to the hospital after surgery: Your blood sugar will be checked by the staff and you will probably be given insulin after surgery (instead of oral diabetes medicines) to make sure you have good blood sugar levels. The goal for blood sugar control after surgery is 80-180 mg/dL.   WHAT DO I DO ABOUT MY DIABETES MEDICATION?  Do not take oral diabetes medicines (pills) the morning of surgery.  Hold Mounjaro for 7 days before surgery. Hold 11/06/22.  DO NOT TAKE THE FOLLOWING 7 DAYS PRIOR TO SURGERY: Ozempic, Wegovy, Rybelsus (Semaglutide), Byetta (exenatide), Bydureon (exenatide ER), Victoza, Saxenda (liraglutide), or Trulicity (dulaglutide) Mounjaro (Tirzepatide) Adlyxin (Lixisenatide), Polyethylene Glycol Loxenatide.   Reviewed and Endorsed by [facility] Patient Education Committee, August 2015                              You may not have any metal on your body including hair pins, jewelry, and body piercing             Do not wear make-up, lotions, powders, perfumes, or deodorant  Do not wear nail polish including gel and S&S, artificial/acrylic nails, or any other type of covering on natural nails including finger and toenails. If you have artificial nails, gel coating, etc. that needs to be removed by a nail salon please have this removed prior to surgery or surgery may need to be canceled/ delayed if the surgeon/ anesthesia feels like they are unable to be safely monitored.   Do not shave  48 hours prior to surgery.  Do not bring valuables to the hospital. [place].   Contacts, glasses, dentures or bridgework may not be worn into surgery.   Bring small overnight bag day of surgery.   DO NOT [place]. PHARMACY WILL DISPENSE MEDICATIONS LISTED ON YOUR MEDICATION LIST TO YOU DURING YOUR ADMISSION [place]!   Special Instructions: Bring a copy of your healthcare power of attorney and living will documents the day of surgery if you haven't scanned them before.              Please read over the following fact sheets you were given: IF [place] [phone] Schneiders    If you received a COVID test during your pre-op visit  it is requested that you wear a mask when out in public, stay away from anyone that may not be feeling well and notify your surgeon if you develop symptoms. If you test positive for Covid or have been in contact with anyone that has tested positive in the last 10 days please notify you surgeon.    [facility] - Preparing for Surgery Before surgery, you can play an important role.  Because skin is not sterile, your skin needs to be as free of germs as possible.  You can reduce the number of germs on your skin by washing with CHG (chlorahexidine gluconate) soap before surgery.  CHG is an antiseptic cleaner which kills germs and bonds with the skin to continue killing germs even after washing. Please DO NOT use if you have an allergy to CHG or antibacterial soaps.  If your skin becomes reddened/irritated stop using the CHG and inform your nurse when you arrive at Short Stay. Do not shave (including legs and underarms) for at least 48 hours prior to the first CHG shower.  You may shave your face/neck.  Please follow these instructions carefully:  1.  Shower with CHG Soap the night before surgery and the  morning of surgery.  2.  If you choose to wash your hair, wash your hair first as usual with your normal  shampoo.  3.  After you shampoo, rinse your hair and body thoroughly to remove the shampoo.                             4.  Use CHG as you would any other liquid soap.  You can apply  chg directly to the skin and wash.  Gently with a scrungie or clean washcloth.  5.  Apply the CHG Soap to your body ONLY FROM THE NECK DOWN.   Do   not use on face/ open                           Wound or open sores. Avoid contact with eyes, ears mouth and   genitals (private parts).                       Wash face,  Genitals (private parts) with your normal soap.             6.  Wash thoroughly, paying special attention to the area where your    surgery  will be performed.  7.  Thoroughly rinse your  body with warm water from the neck down.  8.  DO NOT shower/wash with your normal soap after using and rinsing off the CHG Soap.                9.  Pat yourself dry with a clean towel.            10.  Wear clean pajamas.            11.  Place clean sheets on your bed the night of your first shower and do not  sleep with pets. Day of Surgery : Do not apply any lotions/deodorants the morning of surgery.  Please wear clean clothes to the hospital/surgery center.  FAILURE TO FOLLOW THESE INSTRUCTIONS MAY RESULT IN THE CANCELLATION OF YOUR SURGERY  PATIENT SIGNATURE_________________________________  NURSE SIGNATURE__________________________________  ________________________________________________________________________  Adam Phenix  An incentive spirometer is a tool that can help keep your lungs clear and active. This tool measures how well you are filling your lungs with each breath. Taking long deep breaths may help reverse or decrease the chance of developing breathing (pulmonary) problems (especially infection) following: A long period of time when you are unable to move or be active. BEFORE THE PROCEDURE  If the spirometer includes an indicator to show your best effort, your nurse or respiratory therapist will set it to a desired goal. If possible, sit up straight or lean slightly forward. Try not to slouch. Hold the incentive spirometer in an upright position. INSTRUCTIONS FOR  USE  Sit on the edge of your bed if possible, or sit up as far as you can in bed or on a chair. Hold the incentive spirometer in an upright position. Breathe out normally. Place the mouthpiece in your mouth and seal your lips tightly around it. Breathe in slowly and as deeply as possible, raising the piston or the ball toward the top of the column. Hold your breath for 3-5 seconds or for as long as possible. Allow the piston or ball to fall to the bottom of the column. Remove the mouthpiece from your mouth and breathe out normally. Rest for a few seconds and repeat Steps 1 through 7 at least 10 times every 1-2 hours when you are awake. Take your time and take a few normal breaths between deep breaths. The spirometer may include an indicator to show your best effort. Use the indicator as a goal to work toward during each repetition. After each set of 10 deep breaths, practice coughing to be sure your lungs are clear. If you have an incision (the cut made at the time of surgery), support your incision when coughing by placing a pillow or rolled up towels firmly against it. Once you are able to get out of bed, walk around indoors and cough well. You may stop using the incentive spirometer when instructed by your caregiver.  RISKS AND COMPLICATIONS Take your time so you do not get dizzy or light-headed. If you are in pain, you may need to take or ask for pain medication before doing incentive spirometry. It is harder to take a deep breath if you are having pain. AFTER USE Rest and breathe slowly and easily. It can be helpful to keep track of a log of your progress. Your caregiver can provide you with a simple table to help with this. If you are using the spirometer at home, follow these instructions: [place] IF:  You are having difficultly using the spirometer. You have trouble using the  spirometer as often as instructed. Your pain medication is not giving enough relief while using the  spirometer. You develop fever of 100.5 F (38.1 C) or higher. SEEK IMMEDIATE MEDICAL CARE IF:  You cough up bloody sputum that had not been present before. You develop fever of 102 F (38.9 C) or greater. You develop worsening pain at or near the incision site. MAKE SURE YOU:  Understand these instructions. Will watch your condition. Will get help right away if you are not doing well or get worse. Document Released: 12/30/2006 Document Revised: 11/11/2011 Document Reviewed: 03/02/2007 ExitCare Patient Information 2014 Exit[employer], [place].   ________________________________________________________________________ WHAT IS A BLOOD TRANSFUSION? Blood Transfusion Information  A transfusion is the replacement of blood or some of its parts. Blood is made up of multiple cells which provide different functions. Red blood cells carry oxygen and are used for blood loss replacement. White blood cells fight against infection. Platelets control bleeding. Plasma helps clot blood. Other blood products are available for specialized needs, such as hemophilia or other clotting disorders. BEFORE THE TRANSFUSION  Who gives blood for transfusions?  Healthy volunteers who are fully evaluated to make sure their blood is safe. This is blood bank blood. Transfusion therapy is the safest it has ever been in the practice of medicine. Before blood is taken from a donor, a complete history is taken to make sure that person has no history of diseases nor engages in risky social behavior (examples are intravenous drug use or sexual activity with multiple partners). The donor's travel history is screened to minimize risk of transmitting infections, such as malaria. The donated blood is tested for signs of infectious diseases, such as HIV and hepatitis. The blood is then tested to be sure it is compatible with you in order to minimize the chance of a transfusion reaction. If you or a relative donates blood, this is often done  in anticipation of surgery and is not appropriate for emergency situations. It takes many days to process the donated blood. RISKS AND COMPLICATIONS Although transfusion therapy is very safe and saves many lives, the main dangers of transfusion include:  Getting an infectious disease. Developing a transfusion reaction. This is an allergic reaction to something in the blood you were given. Every precaution is taken to prevent this. The decision to have a blood transfusion has been considered carefully by your caregiver before blood is given. Blood is not given unless the benefits outweigh the risks. AFTER THE TRANSFUSION Right after receiving a blood transfusion, you will usually feel much better and more energetic. This is especially true if your red blood cells have gotten low (anemic). The transfusion raises the level of the red blood cells which carry oxygen, and this usually causes an energy increase. The nurse administering the transfusion will monitor you carefully for complications. HOME CARE INSTRUCTIONS  No special instructions are needed after a transfusion. You may find your energy is better. Speak with your caregiver about any limitations on activity for underlying diseases you may have. SEEK MEDICAL CARE IF:  Your condition is not improving after your transfusion. You develop redness or irritation at the intravenous (IV) site. SEEK IMMEDIATE MEDICAL CARE IF:  Any of the following symptoms occur over the next 12 hours: Shaking chills. You have a temperature by mouth above 102 F (38.9 C), not controlled by medicine. Chest, back, or muscle pain. People around you feel you are not acting correctly or are confused. Shortness of breath or difficulty breathing. Dizziness and  fainting. You get a rash or develop hives. You have a decrease in urine output. Your urine turns a dark color or changes to pink, red, or brown. Any of the following symptoms occur over the next 10 days: You  have a temperature by mouth above 102 F (38.9 C), not controlled by medicine. Shortness of breath. Weakness after normal activity. The white part of the eye turns yellow (jaundice). You have a decrease in the amount of urine or are urinating less often. Your urine turns a dark color or changes to pink, red, or brown. Document Released: 08/16/2000 Document Revised: 11/11/2011 Document Reviewed: 04/04/2008 [facility] Patient Information 2014 [place], [place].  _______________________________________________________________________

## 2022-10-30 NOTE — Progress Notes (Addendum)
COVID Vaccine Completed:  Date of COVID positive in last 90 days:  PCP - Courtney Heys, MD Cardiologist - Michiel Sites Noureddine, MD LOV 10/15/21  Clearance by Andria Rhein, PA 09/16/22 on chart  Chest x-ray -  EKG -  Stress Test - 03/07/18 Epic ECHO - 10/23/20 [facility] Cardiac Cath -  Pacemaker/ICD device last checked: Spinal Cord Stimulator:  Bowel Prep -   Sleep Study -  CPAP -   Fasting Blood Sugar - preDM Checks Blood Sugar _____ times a day  Last dose of GLP1 agonist-  Mounjaro GLP1 instructions:  ??   Last dose of SGLT-2 inhibitors-  N/A SGLT-2 instructions: N/A   Blood Thinner Instructions: Plavix hold 3 days?? Aspirin Instructions: ASA 81 Last Dose:  Activity level:  Can go up a flight of stairs and perform activities of daily living without stopping and without symptoms of chest pain or shortness of breath.  Able to exercise without symptoms  Unable to go up a flight of stairs without symptoms of     Anesthesia review: CAD, PE, old MI  Patient denies shortness of breath, fever, cough and chest pain at PAT appointment  Patient verbalized understanding of instructions that were given to them at the PAT appointment. Patient was also instructed that they will need to review over the PAT instructions again at home before surgery.

## 2022-10-31 ENCOUNTER — Encounter (HOSPITAL_COMMUNITY)
Admission: RE | Admit: 2022-10-31 | Discharge: 2022-10-31 | Disposition: A | Discharge status: Home / Self Care | Payer: Medicare HMO | Source: Ambulatory Visit | Attending: Orthopedic Surgery | Admitting: Orthopedic Surgery

## 2022-10-31 ENCOUNTER — Encounter (HOSPITAL_COMMUNITY): Payer: Self-pay

## 2022-10-31 VITALS — BP 130/86 | HR 90 | Temp 97.8°F | Resp 12 | Ht 62.5 in | Wt 179.0 lb

## 2022-10-31 DIAGNOSIS — I25118 Atherosclerotic heart disease of native coronary artery with other forms of angina pectoris: Secondary | ICD-10-CM | POA: Diagnosis not present

## 2022-10-31 DIAGNOSIS — R7303 Prediabetes: Secondary | ICD-10-CM | POA: Insufficient documentation

## 2022-10-31 DIAGNOSIS — Z01818 Encounter for other preprocedural examination: Secondary | ICD-10-CM | POA: Insufficient documentation

## 2022-10-31 DIAGNOSIS — R9431 Abnormal electrocardiogram [ECG] [EKG]: Secondary | ICD-10-CM | POA: Insufficient documentation

## 2022-10-31 HISTORY — DX: Acute myocardial infarction, unspecified: I21.9

## 2022-10-31 HISTORY — DX: Hypotension, unspecified: I95.9

## 2022-10-31 HISTORY — DX: Hypothyroidism, unspecified: E03.9

## 2022-10-31 HISTORY — DX: Pneumonia, unspecified organism: J18.9

## 2022-10-31 HISTORY — DX: Enterocolitis due to Clostridium difficile, not specified as recurrent: A04.72

## 2022-10-31 HISTORY — DX: Post-traumatic stress disorder, unspecified: F43.10

## 2022-10-31 LAB — CBC
HCT: 43.8 % (ref 36.0–46.0)
Hemoglobin: 14.4 g/dL (ref 12.0–15.0)
MCH: 29.6 pg (ref 26.0–34.0)
MCHC: 32.9 g/dL (ref 30.0–36.0)
MCV: 89.9 fL (ref 80.0–100.0)
Platelets: 347 10*3/uL (ref 150–400)
RBC: 4.87 MIL/uL (ref 3.87–5.11)
RDW: 13.6 % (ref 11.5–15.5)
WBC: 7.2 10*3/uL (ref 4.0–10.5)
nRBC: 0 % (ref 0.0–0.2)

## 2022-10-31 LAB — BASIC METABOLIC PANEL
Anion gap: 9 (ref 5–15)
BUN: 17 mg/dL (ref 6–20)
CO2: 23 mmol/L (ref 22–32)
Calcium: 10.1 mg/dL (ref 8.9–10.3)
Chloride: 102 mmol/L (ref 98–111)
Creatinine, Ser: 0.88 mg/dL (ref 0.44–1.00)
GFR, Estimated: >60 mL/min (ref >60)
Glucose, Bld: 98 mg/dL (ref 70–99)
Potassium: 4 mmol/L (ref 3.5–5.1)
Sodium: 134 mmol/L — ABNORMAL LOW (ref 135–145)

## 2022-10-31 LAB — GLUCOSE, CAPILLARY: Glucose-Capillary: 103 mg/dL — ABNORMAL HIGH (ref 70–99)

## 2022-10-31 LAB — TYPE AND SCREEN
ABO/RH(D): A POS
Antibody Screen: NEGATIVE

## 2022-10-31 LAB — SURGICAL PCR SCREEN
MRSA, PCR: NEGATIVE
Staphylococcus aureus: NEGATIVE

## 2022-11-01 LAB — HEMOGLOBIN A1C
Hgb A1c MFr Bld: 5.5 % (ref 4.8–5.6)
Mean Plasma Glucose: 111 mg/dL

## 2022-11-04 ENCOUNTER — Encounter (HOSPITAL_COMMUNITY): Payer: Self-pay | Admitting: Vascular Surgery

## 2022-11-04 ENCOUNTER — Ambulatory Visit (HOSPITAL_COMMUNITY)
Admission: RE | Disposition: A | Discharge status: Home / Self Care | Payer: Self-pay | Source: Ambulatory Visit | Attending: Vascular Surgery

## 2022-11-04 ENCOUNTER — Ambulatory Visit (HOSPITAL_COMMUNITY)
Admission: RE | Admit: 2022-11-04 | Discharge: 2022-11-04 | Disposition: A | Discharge status: Home / Self Care | Payer: Medicare HMO | Source: Ambulatory Visit | Attending: Vascular Surgery | Admitting: Vascular Surgery

## 2022-11-04 ENCOUNTER — Other Ambulatory Visit: Payer: Self-pay

## 2022-11-04 DIAGNOSIS — Z86718 Personal history of other venous thrombosis and embolism: Secondary | ICD-10-CM | POA: Insufficient documentation

## 2022-11-04 DIAGNOSIS — Z408 Encounter for other prophylactic surgery: Secondary | ICD-10-CM | POA: Insufficient documentation

## 2022-11-04 DIAGNOSIS — Z7902 Long term (current) use of antithrombotics/antiplatelets: Secondary | ICD-10-CM | POA: Diagnosis not present

## 2022-11-04 DIAGNOSIS — Z86711 Personal history of pulmonary embolism: Secondary | ICD-10-CM | POA: Diagnosis not present

## 2022-11-04 DIAGNOSIS — M25551 Pain in right hip: Secondary | ICD-10-CM | POA: Diagnosis not present

## 2022-11-04 HISTORY — PX: IVC VENOGRAPHY: CATH118301

## 2022-11-04 HISTORY — PX: IVC FILTER INSERTION: CATH118245

## 2022-11-04 LAB — GLUCOSE, CAPILLARY: Glucose-Capillary: 116 mg/dL — ABNORMAL HIGH (ref 70–99)

## 2022-11-04 SURGERY — IVC FILTER INSERTION
Anesthesia: LOCAL

## 2022-11-04 MED ORDER — IODIXANOL 320 MG/ML IV SOLN
INTRAVENOUS | Status: DC | PRN
  Administered 2022-11-04: 15 mL

## 2022-11-04 MED ORDER — MIDAZOLAM HCL 2 MG/2ML IJ SOLN
INTRAMUSCULAR | Status: DC | PRN
  Administered 2022-11-04: 1 mg via INTRAVENOUS

## 2022-11-04 MED ORDER — LIDOCAINE HCL (PF) 1 % IJ SOLN
INTRAMUSCULAR | Status: AC
  Filled 2022-11-04: qty 30

## 2022-11-04 MED ORDER — LIDOCAINE HCL (PF) 1 % IJ SOLN
INTRAMUSCULAR | Status: DC | PRN
  Administered 2022-11-04: 18 mL

## 2022-11-04 MED ORDER — HEPARIN (PORCINE) IN NACL 1000-0.9 UT/500ML-% IV SOLN
INTRAVENOUS | Status: DC | PRN
  Administered 2022-11-04: 500 mL

## 2022-11-04 MED ORDER — FENTANYL CITRATE (PF) 100 MCG/2ML IJ SOLN
INTRAMUSCULAR | Status: DC | PRN
  Administered 2022-11-04: 25 ug via INTRAVENOUS

## 2022-11-04 MED ORDER — MIDAZOLAM HCL 5 MG/5ML IJ SOLN
INTRAMUSCULAR | Status: AC
  Filled 2022-11-04: qty 5

## 2022-11-04 MED ORDER — ACETAMINOPHEN 325 MG PO TABS
ORAL_TABLET | ORAL | Status: AC
  Administered 2022-11-04: 650 mg via ORAL
  Filled 2022-11-04: qty 2

## 2022-11-04 MED ORDER — SODIUM CHLORIDE 0.9 % IV SOLN: INTRAVENOUS | Status: DC

## 2022-11-04 MED ORDER — ACETAMINOPHEN 325 MG PO TABS: 650.0000 mg | ORAL_TABLET | Freq: Four times a day (QID) | ORAL | Status: DC | PRN

## 2022-11-04 MED ORDER — FENTANYL CITRATE (PF) 100 MCG/2ML IJ SOLN
INTRAMUSCULAR | Status: AC
  Filled 2022-11-04: qty 2

## 2022-11-04 SURGICAL SUPPLY — 9 items
COVER DOME SNAP 22 D (MISCELLANEOUS) IMPLANT
FILTER VC CELECT-FEMORAL (Filter) IMPLANT
KIT MICROPUNCTURE NIT STIFF (SHEATH) IMPLANT
KIT PV (KITS) ×1 IMPLANT
PROTECTION STATION PRESSURIZED (MISCELLANEOUS) ×1
SHEATH PROBE COVER 6X72 (BAG) IMPLANT
STATION PROTECTION PRESSURIZED (MISCELLANEOUS) IMPLANT
TRAY PV CATH (CUSTOM PROCEDURE TRAY) ×1 IMPLANT
WIRE BENTSON .035X145CM (WIRE) IMPLANT

## 2022-11-04 NOTE — Op Note (Signed)
    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 Donzetta Matters, MD Procedure Performed: 1.  Ultrasound-guided cannulation right common femoral vein 2.  IVC venogram 3.  Placement of Cook Celect IVC filter 4.  Moderate sedation with fentanyl and Versed for 9 minutes  Indications: [age] year old female with history of DVT with PE now with need for total hip replacement on the right.  Plan is for placement of temporary filter and will remove in 3 months if she has recovered from her surgery.  Findings: The IVC measured less than 25 mm and the filter was placed just below the renal veins and appeared to be positioned midline in the IVC.   Procedure:  The patient was identified in the holding area and taken to room 2.  The patient was then placed supine on the table and prepped and draped in the usual sterile fashion.  A time out was called.  Ultrasound was used to evaluate the right common femoral vein which was patent and compressible.  The area was anesthetized 1% lidocaine cannulated with micropuncture needle followed by wire and sheath.  And images saved the permanent record.  Concomitantly we administered fentanyl and Versed for moderate sedation and her vital signs were monitored by bedside nursing throughout the case.  We placed a micropuncture sheath followed by a Bentson wire and then placed the filter introducer sheath.  Central venogram was performed.  The renal veins bilaterally were marked and then the introducer sheath was advanced over the wire to this level and the dilator removed.  The filter was then placed at that level and deployed.  The filter appeared to be midline no additional venography was performed.  She was removed and pressure was held until hemostasis obtained.  Patient tolerated procedure well without any  complication.  Contrast: 15cc   Janeka Libman C. Donzetta Matters, MD [facility] Office: [phone] Pager: [phone]
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
## 2022-11-05 MED FILL — Midazolam HCl Inj 5 MG/5ML (Base Equivalent): INTRAMUSCULAR | Qty: 1 | Status: AC

## 2022-11-06 ENCOUNTER — Telehealth: Payer: Self-pay

## 2022-11-06 NOTE — Telephone Encounter (Signed)
Patient called stating she had IVC filter placed on 11/04/22 by Dr. Donzetta Matters and needed clarification on when she could stop the dressing changes at the insertion site. Patient stated she hasn't noticed any drainage on the dressings but there is a tiny opening. I advised to continue to keep the area covered to ensure and once the area has healed then she is ok to keep the dressing off. Patient voiced her understanding.

## 2022-11-10 ENCOUNTER — Ambulatory Visit: Payer: Self-pay | Admitting: Student
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
## 2022-11-11 ENCOUNTER — Encounter (HOSPITAL_COMMUNITY): Payer: Self-pay | Admitting: Physician Assistant
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
## 2022-11-13 ENCOUNTER — Encounter (HOSPITAL_COMMUNITY): Payer: Self-pay | Admitting: Orthopedic Surgery

## 2022-11-13 ENCOUNTER — Ambulatory Visit (HOSPITAL_COMMUNITY): Payer: Medicare HMO

## 2022-11-13 ENCOUNTER — Other Ambulatory Visit: Payer: Self-pay

## 2022-11-13 ENCOUNTER — Encounter (HOSPITAL_COMMUNITY)
Admission: RE | Disposition: A | Discharge status: Home / Self Care | Payer: Self-pay | Source: Ambulatory Visit | Attending: Orthopedic Surgery

## 2022-11-13 ENCOUNTER — Ambulatory Visit (HOSPITAL_BASED_OUTPATIENT_CLINIC_OR_DEPARTMENT_OTHER): Payer: Medicare HMO | Admitting: Physician Assistant

## 2022-11-13 ENCOUNTER — Ambulatory Visit (HOSPITAL_COMMUNITY)
Admission: RE | Admit: 2022-11-13 | Discharge: 2022-11-14 | Disposition: A | Discharge status: Home / Self Care | Payer: Medicare HMO | Source: Ambulatory Visit | Attending: Orthopedic Surgery | Admitting: Orthopedic Surgery

## 2022-11-13 ENCOUNTER — Ambulatory Visit (HOSPITAL_COMMUNITY): Payer: Medicare HMO | Admitting: Physician Assistant

## 2022-11-13 DIAGNOSIS — I2510 Atherosclerotic heart disease of native coronary artery without angina pectoris: Secondary | ICD-10-CM | POA: Diagnosis not present

## 2022-11-13 DIAGNOSIS — E039 Hypothyroidism, unspecified: Secondary | ICD-10-CM | POA: Diagnosis not present

## 2022-11-13 DIAGNOSIS — I252 Old myocardial infarction: Secondary | ICD-10-CM

## 2022-11-13 DIAGNOSIS — Z87891 Personal history of nicotine dependence: Secondary | ICD-10-CM | POA: Insufficient documentation

## 2022-11-13 DIAGNOSIS — M1611 Unilateral primary osteoarthritis, right hip: Secondary | ICD-10-CM | POA: Diagnosis not present

## 2022-11-13 DIAGNOSIS — Z955 Presence of coronary angioplasty implant and graft: Secondary | ICD-10-CM

## 2022-11-13 DIAGNOSIS — Z09 Encounter for follow-up examination after completed treatment for conditions other than malignant neoplasm: Secondary | ICD-10-CM | POA: Diagnosis not present

## 2022-11-13 DIAGNOSIS — R7303 Prediabetes: Secondary | ICD-10-CM

## 2022-11-13 DIAGNOSIS — Z96641 Presence of right artificial hip joint: Secondary | ICD-10-CM

## 2022-11-13 HISTORY — PX: TOTAL HIP ARTHROPLASTY: SHX124

## 2022-11-13 LAB — ABO/RH: ABO/RH(D): A POS

## 2022-11-13 LAB — GLUCOSE, CAPILLARY: Glucose-Capillary: 121 mg/dL — ABNORMAL HIGH (ref 70–99)

## 2022-11-13 SURGERY — ARTHROPLASTY, HIP, TOTAL, ANTERIOR APPROACH
Anesthesia: Spinal | Site: Hip | Laterality: Right

## 2022-11-13 MED ORDER — DOCUSATE SODIUM 100 MG PO CAPS
100.0000 mg | ORAL_CAPSULE | Freq: Two times a day (BID) | ORAL | Status: DC
  Administered 2022-11-13 – 2022-11-14 (×3): 100 mg via ORAL
  Filled 2022-11-13 (×3): qty 1

## 2022-11-13 MED ORDER — CLONAZEPAM 1 MG PO TABS
1.0000 mg | ORAL_TABLET | Freq: Three times a day (TID) | ORAL | Status: DC
  Administered 2022-11-13 – 2022-11-14 (×3): 1 mg via ORAL
  Filled 2022-11-13 (×3): qty 1

## 2022-11-13 MED ORDER — DEXAMETHASONE SODIUM PHOSPHATE 10 MG/ML IJ SOLN
INTRAMUSCULAR | Status: DC | PRN
  Administered 2022-11-13: 10 mg via INTRAVENOUS

## 2022-11-13 MED ORDER — KETOROLAC TROMETHAMINE 30 MG/ML IJ SOLN
INTRAMUSCULAR | Status: DC | PRN
  Administered 2022-11-13: 30 mg

## 2022-11-13 MED ORDER — MORPHINE SULFATE (PF) 2 MG/ML IV SOLN: 0.5000 mg | INTRAVENOUS | Status: DC | PRN

## 2022-11-13 MED ORDER — POVIDONE-IODINE 10 % EX SWAB
2.0000 | Freq: Once | CUTANEOUS | Status: AC
  Administered 2022-11-13: 2 via TOPICAL

## 2022-11-13 MED ORDER — ISOPROPYL ALCOHOL 70 % SOLN
Status: AC
  Filled 2022-11-13: qty 480

## 2022-11-13 MED ORDER — METHOCARBAMOL 500 MG IVPB - SIMPLE MED
500.0000 mg | Freq: Four times a day (QID) | INTRAVENOUS | Status: DC | PRN
  Administered 2022-11-13: 500 mg via INTRAVENOUS

## 2022-11-13 MED ORDER — BUPIVACAINE-EPINEPHRINE 0.25% -1:200000 IJ SOLN
INTRAMUSCULAR | Status: DC | PRN
  Administered 2022-11-13: 30 mL

## 2022-11-13 MED ORDER — ACETAMINOPHEN 325 MG PO TABS: 325.0000 mg | ORAL_TABLET | Freq: Four times a day (QID) | ORAL | Status: DC | PRN

## 2022-11-13 MED ORDER — PHENOL 1.4 % MT LIQD: 1.0000 | OROMUCOSAL | Status: DC | PRN

## 2022-11-13 MED ORDER — ACETAMINOPHEN 500 MG PO TABS
1000.0000 mg | ORAL_TABLET | Freq: Once | ORAL | Status: AC
  Administered 2022-11-13: 1000 mg via ORAL
  Filled 2022-11-13: qty 2

## 2022-11-13 MED ORDER — ONDANSETRON HCL 4 MG PO TABS: 4.0000 mg | ORAL_TABLET | Freq: Four times a day (QID) | ORAL | Status: DC | PRN

## 2022-11-13 MED ORDER — PROPOFOL 10 MG/ML IV BOLUS
INTRAVENOUS | Status: AC
  Filled 2022-11-13: qty 20

## 2022-11-13 MED ORDER — FENTANYL CITRATE (PF) 100 MCG/2ML IJ SOLN
INTRAMUSCULAR | Status: AC
  Filled 2022-11-13: qty 2

## 2022-11-13 MED ORDER — ORAL CARE MOUTH RINSE: 15.0000 mL | OROMUCOSAL | Status: DC | PRN

## 2022-11-13 MED ORDER — CLOPIDOGREL BISULFATE 75 MG PO TABS
75.0000 mg | ORAL_TABLET | Freq: Every day | ORAL | Status: DC
  Administered 2022-11-13: 75 mg via ORAL
  Filled 2022-11-13: qty 1

## 2022-11-13 MED ORDER — POLYETHYLENE GLYCOL 3350 17 G PO PACK: 17.0000 g | PACK | Freq: Every day | ORAL | Status: DC | PRN

## 2022-11-13 MED ORDER — HYDROCODONE-ACETAMINOPHEN 7.5-325 MG PO TABS
1.0000 | ORAL_TABLET | ORAL | Status: DC | PRN
  Administered 2022-11-13 (×2): 2 via ORAL
  Filled 2022-11-13 (×2): qty 2

## 2022-11-13 MED ORDER — BUPIVACAINE-EPINEPHRINE (PF) 0.25% -1:200000 IJ SOLN
INTRAMUSCULAR | Status: AC
  Filled 2022-11-13: qty 30

## 2022-11-13 MED ORDER — MENTHOL 3 MG MT LOZG: 1.0000 | LOZENGE | OROMUCOSAL | Status: DC | PRN

## 2022-11-13 MED ORDER — FENOFIBRATE 160 MG PO TABS
160.0000 mg | ORAL_TABLET | Freq: Every day | ORAL | Status: DC
  Administered 2022-11-13: 160 mg via ORAL
  Filled 2022-11-13: qty 1

## 2022-11-13 MED ORDER — HYDROMORPHONE HCL 1 MG/ML IJ SOLN
0.2500 mg | INTRAMUSCULAR | Status: DC | PRN
  Administered 2022-11-13: .5 mg via INTRAVENOUS
  Administered 2022-11-13 (×2): 0.5 mg via INTRAVENOUS
  Administered 2022-11-13: .5 mg via INTRAVENOUS

## 2022-11-13 MED ORDER — METHOCARBAMOL 500 MG PO TABS: 500.0000 mg | ORAL_TABLET | Freq: Four times a day (QID) | ORAL | Status: DC | PRN

## 2022-11-13 MED ORDER — ROCURONIUM BROMIDE 100 MG/10ML IV SOLN
INTRAVENOUS | Status: DC | PRN
  Administered 2022-11-13: 70 mg via INTRAVENOUS

## 2022-11-13 MED ORDER — CEFAZOLIN SODIUM-DEXTROSE 2-4 GM/100ML-% IV SOLN
2.0000 g | INTRAVENOUS | Status: AC
  Administered 2022-11-13: 2 g via INTRAVENOUS
  Filled 2022-11-13: qty 100

## 2022-11-13 MED ORDER — HYDROMORPHONE HCL 1 MG/ML IJ SOLN
INTRAMUSCULAR | Status: AC
  Filled 2022-11-13: qty 1

## 2022-11-13 MED ORDER — WATER FOR IRRIGATION, STERILE IR SOLN
Status: DC | PRN
  Administered 2022-11-13: 2000 mL

## 2022-11-13 MED ORDER — METOCLOPRAMIDE HCL 5 MG PO TABS: 5.0000 mg | ORAL_TABLET | Freq: Three times a day (TID) | ORAL | Status: DC | PRN

## 2022-11-13 MED ORDER — MIDAZOLAM HCL 2 MG/2ML IJ SOLN
INTRAMUSCULAR | Status: AC
  Filled 2022-11-13: qty 2

## 2022-11-13 MED ORDER — TIZANIDINE HCL 4 MG PO TABS
2.0000 mg | ORAL_TABLET | Freq: Three times a day (TID) | ORAL | Status: DC
  Administered 2022-11-13 – 2022-11-14 (×3): 2 mg via ORAL
  Filled 2022-11-13 (×3): qty 1

## 2022-11-13 MED ORDER — OXYCODONE HCL 5 MG/5ML PO SOLN: 5.0000 mg | Freq: Once | ORAL | Status: DC | PRN

## 2022-11-13 MED ORDER — PROPOFOL 1000 MG/100ML IV EMUL
INTRAVENOUS | Status: AC
  Filled 2022-11-13: qty 100

## 2022-11-13 MED ORDER — ONDANSETRON HCL 4 MG/2ML IJ SOLN
INTRAMUSCULAR | Status: DC | PRN
  Administered 2022-11-13: 4 mg via INTRAVENOUS

## 2022-11-13 MED ORDER — CHLORHEXIDINE GLUCONATE 0.12 % MT SOLN
15.0000 mL | Freq: Once | OROMUCOSAL | Status: AC
  Administered 2022-11-13: 15 mL via OROMUCOSAL

## 2022-11-13 MED ORDER — METHOCARBAMOL 500 MG IVPB - SIMPLE MED
INTRAVENOUS | Status: AC
  Filled 2022-11-13: qty 55

## 2022-11-13 MED ORDER — DEXAMETHASONE SODIUM PHOSPHATE 10 MG/ML IJ SOLN
INTRAMUSCULAR | Status: AC
  Filled 2022-11-13: qty 1

## 2022-11-13 MED ORDER — POLYVINYL ALCOHOL 1.4 % OP SOLN
1.0000 [drp] | Freq: Every day | OPHTHALMIC | Status: DC | PRN
  Filled 2022-11-13: qty 15

## 2022-11-13 MED ORDER — CELECOXIB 200 MG PO CAPS
200.0000 mg | ORAL_CAPSULE | Freq: Two times a day (BID) | ORAL | Status: DC
  Administered 2022-11-13 – 2022-11-14 (×3): 200 mg via ORAL
  Filled 2022-11-13 (×3): qty 1

## 2022-11-13 MED ORDER — POVIDONE-IODINE 10 % EX SWAB: 2.0000 | Freq: Once | CUTANEOUS | Status: DC

## 2022-11-13 MED ORDER — KETOROLAC TROMETHAMINE 30 MG/ML IJ SOLN
30.0000 mg | Freq: Once | INTRAMUSCULAR | Status: AC | PRN
  Administered 2022-11-13: 30 mg via INTRAVENOUS

## 2022-11-13 MED ORDER — HYDROXYZINE HCL 25 MG PO TABS
50.0000 mg | ORAL_TABLET | Freq: Two times a day (BID) | ORAL | Status: DC
  Administered 2022-11-13 – 2022-11-14 (×2): 50 mg via ORAL
  Filled 2022-11-13 (×2): qty 2

## 2022-11-13 MED ORDER — SODIUM CHLORIDE (PF) 0.9 % IJ SOLN
INTRAMUSCULAR | Status: AC
  Filled 2022-11-13: qty 30

## 2022-11-13 MED ORDER — ALUM & MAG HYDROXIDE-SIMETH 200-200-20 MG/5ML PO SUSP: 30.0000 mL | ORAL | Status: DC | PRN

## 2022-11-13 MED ORDER — ONDANSETRON HCL 4 MG/2ML IJ SOLN: 4.0000 mg | Freq: Four times a day (QID) | INTRAMUSCULAR | Status: DC | PRN

## 2022-11-13 MED ORDER — PRAVASTATIN SODIUM 20 MG PO TABS
80.0000 mg | ORAL_TABLET | Freq: Every day | ORAL | Status: DC
  Administered 2022-11-13: 80 mg via ORAL
  Filled 2022-11-13: qty 4

## 2022-11-13 MED ORDER — DIPHENHYDRAMINE HCL 12.5 MG/5ML PO ELIX: 12.5000 mg | ORAL_SOLUTION | ORAL | Status: DC | PRN

## 2022-11-13 MED ORDER — APIXABAN 2.5 MG PO TABS
2.5000 mg | ORAL_TABLET | Freq: Two times a day (BID) | ORAL | Status: DC
  Administered 2022-11-14: 2.5 mg via ORAL
  Filled 2022-11-13: qty 1

## 2022-11-13 MED ORDER — ORAL CARE MOUTH RINSE: 15.0000 mL | Freq: Once | OROMUCOSAL | Status: AC

## 2022-11-13 MED ORDER — PHENYLEPHRINE HCL (PRESSORS) 10 MG/ML IV SOLN
INTRAVENOUS | Status: AC
  Filled 2022-11-13: qty 1

## 2022-11-13 MED ORDER — SENNA 8.6 MG PO TABS
1.0000 | ORAL_TABLET | Freq: Two times a day (BID) | ORAL | Status: DC
  Administered 2022-11-13 – 2022-11-14 (×3): 8.6 mg via ORAL
  Filled 2022-11-13 (×3): qty 1

## 2022-11-13 MED ORDER — METOCLOPRAMIDE HCL 5 MG/ML IJ SOLN: 5.0000 mg | Freq: Three times a day (TID) | INTRAMUSCULAR | Status: DC | PRN

## 2022-11-13 MED ORDER — KETOROLAC TROMETHAMINE 30 MG/ML IJ SOLN
INTRAMUSCULAR | Status: AC
  Filled 2022-11-13: qty 1

## 2022-11-13 MED ORDER — SODIUM CHLORIDE 0.9 % IR SOLN
Status: DC | PRN
  Administered 2022-11-13: 1000 mL

## 2022-11-13 MED ORDER — LEVOTHYROXINE SODIUM 112 MCG PO TABS
112.0000 ug | ORAL_TABLET | Freq: Every day | ORAL | Status: DC
  Administered 2022-11-14: 112 ug via ORAL
  Filled 2022-11-13: qty 1

## 2022-11-13 MED ORDER — ONDANSETRON HCL 4 MG/2ML IJ SOLN
INTRAMUSCULAR | Status: AC
  Filled 2022-11-13: qty 2

## 2022-11-13 MED ORDER — SODIUM CHLORIDE 0.9 % IV SOLN: INTRAVENOUS | Status: DC

## 2022-11-13 MED ORDER — LIDOCAINE HCL (CARDIAC) PF 100 MG/5ML IV SOSY
PREFILLED_SYRINGE | INTRAVENOUS | Status: DC | PRN
  Administered 2022-11-13: 50 mg via INTRAVENOUS

## 2022-11-13 MED ORDER — LACTATED RINGERS IV SOLN: INTRAVENOUS | Status: DC

## 2022-11-13 MED ORDER — CARVEDILOL 6.25 MG PO TABS
6.2500 mg | ORAL_TABLET | Freq: Every day | ORAL | Status: DC
  Administered 2022-11-13: 6.25 mg via ORAL
  Filled 2022-11-13: qty 1

## 2022-11-13 MED ORDER — AMITRIPTYLINE HCL 25 MG PO TABS
25.0000 mg | ORAL_TABLET | Freq: Every day | ORAL | Status: DC
  Administered 2022-11-13: 25 mg via ORAL
  Filled 2022-11-13: qty 1

## 2022-11-13 MED ORDER — FENTANYL CITRATE (PF) 100 MCG/2ML IJ SOLN
INTRAMUSCULAR | Status: DC | PRN
  Administered 2022-11-13 (×2): 50 ug via INTRAVENOUS
  Administered 2022-11-13: 100 ug via INTRAVENOUS

## 2022-11-13 MED ORDER — ISOPROPYL ALCOHOL 70 % SOLN
Status: DC | PRN
  Administered 2022-11-13: 1 via TOPICAL

## 2022-11-13 MED ORDER — CEFAZOLIN SODIUM-DEXTROSE 2-4 GM/100ML-% IV SOLN
2.0000 g | Freq: Four times a day (QID) | INTRAVENOUS | Status: AC
  Administered 2022-11-13 (×2): 2 g via INTRAVENOUS
  Filled 2022-11-13 (×2): qty 100

## 2022-11-13 MED ORDER — PROPOFOL 10 MG/ML IV BOLUS
INTRAVENOUS | Status: DC | PRN
  Administered 2022-11-13: 150 mg via INTRAVENOUS

## 2022-11-13 MED ORDER — SUGAMMADEX SODIUM 200 MG/2ML IV SOLN
INTRAVENOUS | Status: DC | PRN
  Administered 2022-11-13: 200 mg via INTRAVENOUS

## 2022-11-13 MED ORDER — TRANEXAMIC ACID-NACL 1000-0.7 MG/100ML-% IV SOLN
1000.0000 mg | INTRAVENOUS | Status: AC
  Administered 2022-11-13: 1000 mg via INTRAVENOUS
  Filled 2022-11-13: qty 100

## 2022-11-13 MED ORDER — OXYCODONE HCL 5 MG PO TABS: 5.0000 mg | ORAL_TABLET | Freq: Once | ORAL | Status: DC | PRN

## 2022-11-13 MED ORDER — PRAZOSIN HCL 5 MG PO CAPS
5.0000 mg | ORAL_CAPSULE | Freq: Every day | ORAL | Status: DC
  Administered 2022-11-13: 5 mg via ORAL
  Filled 2022-11-13: qty 1

## 2022-11-13 MED ORDER — ADULT MULTIVITAMIN W/MINERALS CH
1.0000 | ORAL_TABLET | Freq: Every day | ORAL | Status: DC
  Administered 2022-11-13 – 2022-11-14 (×2): 1 via ORAL
  Filled 2022-11-13 (×2): qty 1

## 2022-11-13 MED ORDER — MIDAZOLAM HCL 5 MG/5ML IJ SOLN
INTRAMUSCULAR | Status: DC | PRN
  Administered 2022-11-13: 2 mg via INTRAVENOUS

## 2022-11-13 MED ORDER — SODIUM CHLORIDE (PF) 0.9 % IJ SOLN
INTRAMUSCULAR | Status: DC | PRN
  Administered 2022-11-13: 30 mL

## 2022-11-13 MED ORDER — FLUOCINOLONE ACETONIDE 0.025 % EX OINT: 1.0000 | TOPICAL_OINTMENT | Freq: Two times a day (BID) | CUTANEOUS | Status: DC | PRN

## 2022-11-13 MED ORDER — ONDANSETRON HCL 4 MG/2ML IJ SOLN: 4.0000 mg | Freq: Once | INTRAMUSCULAR | Status: DC | PRN

## 2022-11-13 MED ORDER — HYDROCODONE-ACETAMINOPHEN 5-325 MG PO TABS
1.0000 | ORAL_TABLET | ORAL | Status: DC | PRN
  Administered 2022-11-14: 2 via ORAL
  Filled 2022-11-13: qty 2

## 2022-11-13 SURGICAL SUPPLY — 60 items
ADH SKN CLS APL DERMABOND .7 (GAUZE/BANDAGES/DRESSINGS) ×2
ADH SKN CLS LQ APL DERMABOND (GAUZE/BANDAGES/DRESSINGS) ×1
APL PRP STRL LF DISP 70% ISPRP (MISCELLANEOUS) ×1
BAG COUNTER SPONGE SURGICOUNT (BAG) IMPLANT
BAG DECANTER FOR FLEXI CONT (MISCELLANEOUS) IMPLANT
BAG SPEC THK2 15X12 ZIP CLS (MISCELLANEOUS)
BAG SPNG CNTER NS LX DISP (BAG)
BAG ZIPLOCK 12X15 (MISCELLANEOUS) IMPLANT
CHLORAPREP W/TINT 26 (MISCELLANEOUS) ×1 IMPLANT
COVER PERINEAL POST (MISCELLANEOUS) ×1 IMPLANT
COVER SURGICAL LIGHT HANDLE (MISCELLANEOUS) ×1 IMPLANT
DERMABOND ADVANCED .7 DNX12 (GAUZE/BANDAGES/DRESSINGS) ×2 IMPLANT
DERMABOND ADVANCED .7 DNX6 (GAUZE/BANDAGES/DRESSINGS) IMPLANT
DRAPE IMP U-DRAPE 54X76 (DRAPES) ×1 IMPLANT
DRAPE SHEET LG 3/4 BI-LAMINATE (DRAPES) ×3 IMPLANT
DRAPE STERI IOBAN 125X83 (DRAPES) ×1 IMPLANT
DRAPE U-SHAPE 47X51 STRL (DRAPES) ×2 IMPLANT
DRSG AQUACEL AG ADV 3.5X10 (GAUZE/BANDAGES/DRESSINGS) ×1 IMPLANT
ELECT REM PT RETURN 15FT ADLT (MISCELLANEOUS) ×1 IMPLANT
GAUZE SPONGE 4X4 12PLY STRL (GAUZE/BANDAGES/DRESSINGS) ×1 IMPLANT
GLOVE BIO SURGEON STRL SZ7 (GLOVE) ×1 IMPLANT
GLOVE BIO SURGEON STRL SZ8.5 (GLOVE) ×2 IMPLANT
GLOVE BIOGEL PI IND STRL 7.5 (GLOVE) ×1 IMPLANT
GLOVE BIOGEL PI IND STRL 8.5 (GLOVE) ×1 IMPLANT
GOWN SPEC L3 XXLG W/TWL (GOWN DISPOSABLE) ×1 IMPLANT
GOWN STRL REUS W/ TWL XL LVL3 (GOWN DISPOSABLE) ×1 IMPLANT
GOWN STRL REUS W/TWL XL LVL3 (GOWN DISPOSABLE) ×1
HANDPIECE INTERPULSE COAX TIP (DISPOSABLE) ×1
HEAD CERAMIC BIOLOX 36MM (Head) IMPLANT
HIP SLEEVE BIOLOX -6MM OFFSET (Sleeve) ×1 IMPLANT
HOLDER FOLEY CATH W/STRAP (MISCELLANEOUS) ×1 IMPLANT
HOOD PEEL AWAY T7 (MISCELLANEOUS) ×3 IMPLANT
KIT TURNOVER KIT A (KITS) IMPLANT
LINER ACETAB VIT E +5 36 F (Liner) IMPLANT
MANIFOLD NEPTUNE II (INSTRUMENTS) ×1 IMPLANT
MARKER SKIN DUAL TIP RULER LAB (MISCELLANEOUS) ×1 IMPLANT
NDL SAFETY ECLIP 18X1.5 (MISCELLANEOUS) ×1 IMPLANT
NDL SPNL 18GX3.5 QUINCKE PK (NEEDLE) ×1 IMPLANT
NEEDLE SPNL 18GX3.5 QUINCKE PK (NEEDLE) ×1 IMPLANT
PACK ANTERIOR HIP CUSTOM (KITS) ×1 IMPLANT
PENCIL SMOKE EVACUATOR (MISCELLANEOUS) IMPLANT
SAW OSC TIP CART 19.5X105X1.3 (SAW) ×1 IMPLANT
SEALER BIPOLAR AQUA 6.0 (INSTRUMENTS) ×1 IMPLANT
SET HNDPC FAN SPRY TIP SCT (DISPOSABLE) ×1 IMPLANT
SHELL ACET G7 4H 56 SZF (Shell) IMPLANT
SLEEVE HIP BIOLOX -6MM OFFSET (Sleeve) IMPLANT
SOLUTION PRONTOSAN WOUND 350ML (IRRIGATION / IRRIGATOR) ×1 IMPLANT
SPIKE FLUID TRANSFER (MISCELLANEOUS) ×1 IMPLANT
STEM FEM CMTLS HO 12X109 133D (Stem) IMPLANT
SUT MNCRL AB 3-0 PS2 18 (SUTURE) ×1 IMPLANT
SUT MON AB 2-0 CT1 36 (SUTURE) ×1 IMPLANT
SUT STRATAFIX PDO 1 14 VIOLET (SUTURE) ×1
SUT STRATFX PDO 1 14 VIOLET (SUTURE) ×1
SUT VIC AB 2-0 CT1 27 (SUTURE)
SUT VIC AB 2-0 CT1 TAPERPNT 27 (SUTURE) IMPLANT
SUTURE STRATFX PDO 1 14 VIOLET (SUTURE) ×1 IMPLANT
SYR 3ML LL SCALE MARK (SYRINGE) ×1 IMPLANT
TRAY FOLEY MTR SLVR 16FR STAT (SET/KITS/TRAYS/PACK) IMPLANT
TUBE SUCTION HIGH CAP CLEAR NV (SUCTIONS) ×1 IMPLANT
WATER STERILE IRR 1000ML POUR (IV SOLUTION) ×1 IMPLANT

## 2022-11-13 NOTE — Anesthesia Procedure Notes (Signed)
Procedure Name: Intubation Date/Time: 11/13/2022 10:53 AM  Performed by: Randye Lobo, CRNAPre-anesthesia Checklist: Patient identified, Emergency Drugs available, Suction available and Patient being monitored Patient Re-evaluated:Patient Re-evaluated prior to induction Oxygen Delivery Method: Circle System Utilized Preoxygenation: Pre-oxygenation with 100% oxygen Induction Type: IV induction Ventilation: Mask ventilation without difficulty Grade View: Grade II Tube type: Oral Tube size: 7.0 mm Number of attempts: 1 Airway Equipment and Method: Stylet and Oral airway Placement Confirmation: ETT inserted through vocal cords under direct vision, positive ETCO2 and breath sounds checked- equal and bilateral Secured at: 20 cm Tube secured with: Tape Dental Injury: Teeth and Oropharynx as per pre-operative assessment

## 2022-11-13 NOTE — Transfer of Care (Signed)
Immediate Anesthesia Transfer of Care Note  Patient: Melanie Dudley  Procedure(s) Performed: TOTAL HIP ARTHROPLASTY ANTERIOR APPROACH (Right: Hip)  Patient Location: PACU  Anesthesia Type:General  Level of Consciousness: awake, alert , oriented, patient cooperative, and responds to stimulation  Airway & Oxygen Therapy: Patient Spontanous Breathing and Patient connected to nasal cannula oxygen  Post-op Assessment: Report given to RN, Post -op Vital signs reviewed and stable, and Patient moving all extremities  Post vital signs: Reviewed and stable  Last Vitals:  Vitals Value Taken Time  BP 121/71 11/13/22 1300  Temp 36.4 C 11/13/22 1300  Pulse 87 11/13/22 1304  Resp 9 11/13/22 1304  SpO2 96 % 11/13/22 1304  Vitals shown include unvalidated device data.  Last Pain:  Vitals:   11/13/22 1300  TempSrc:   PainSc: 10-Worst pain ever         Complications: No notable events documented.

## 2022-11-13 NOTE — Discharge Instructions (Addendum)
 Dr. Brian Swinteck Joint Replacement Specialist [facility] [address] [place], [place] [address] [phone]   TOTAL HIP REPLACEMENT POSTOPERATIVE DIRECTIONS    Hip Rehabilitation, Guidelines Following Surgery   WEIGHT BEARING Weight bearing as tolerated with assist device (walker, cane, etc) as directed, use it as long as suggested by your surgeon or therapist, typically at least 4-6 weeks.  The results of a hip operation are greatly improved after range of motion and muscle strengthening exercises. Follow all safety measures which are given to protect your hip. If any of these exercises cause increased pain or swelling in your joint, decrease the amount until you are comfortable again. Then slowly increase the exercises. Call your caregiver if you have problems or questions.   HOME CARE INSTRUCTIONS  Most of the following instructions are designed to prevent the dislocation of your new hip.  Remove items at home which could result in a fall. This includes throw rugs or furniture in walking pathways.  Continue medications as instructed at time of discharge. You may have some home medications which will be placed on hold until you complete the course of blood thinner medication. You may start showering once you are discharged home. Do not remove your dressing. Do not put on socks or shoes without following the instructions of your caregivers.   Sit on chairs with arms. Use the chair arms to help push yourself up when arising.  Arrange for the use of a toilet seat elevator so you are not sitting low.  Walk with walker as instructed.  You may resume a sexual relationship in one month or when given the OK by your caregiver.  Use walker as long as suggested by your caregivers.  You may put full weight on your legs and walk as much as is comfortable. Avoid periods of inactivity such as sitting longer than an hour when not asleep. This helps prevent blood  clots.  You may return to work once you are cleared by your surgeon.  Do not drive a car for 6 weeks or until released by your surgeon.  Do not drive while taking narcotics.  Wear elastic stockings for two weeks following surgery during the day but you may remove then at night.  Make sure you keep all of your appointments after your operation with all of your doctors and caregivers. You should call the office at the above phone number and make an appointment for approximately two weeks after the date of your surgery. Please pick up a stool softener and laxative for home use as long as you are requiring pain medications. ICE to the affected hip every three hours for 30 minutes at a time and then as needed for pain and swelling. Continue to use ice on the hip for pain and swelling from surgery. You may notice swelling that will progress down to the foot and ankle.  This is normal after surgery.  Elevate the leg when you are not up walking on it.   It is important for you to complete the blood thinner medication as prescribed by your doctor. Continue to use the breathing machine which will help keep your temperature down.  It is common for your temperature to cycle up and down following surgery, especially at night when you are not up moving around and exerting yourself.  The breathing machine keeps your lungs expanded and your temperature down.  RANGE OF MOTION AND STRENGTHENING EXERCISES  These exercises are designed to help you   keep full movement of your hip joint. Follow your caregiver's or physical therapist's instructions. Perform all exercises about fifteen times, three times per day or as directed. Exercise both hips, even if you have had only one joint replacement. These exercises can be done on a training (exercise) mat, on the floor, on a table or on a bed. Use whatever works the best and is most comfortable for you. Use music or television while you are exercising so that the exercises are a  pleasant break in your day. This will make your life better with the exercises acting as a break in routine you can look forward to.  Lying on your back, slowly slide your foot toward your buttocks, raising your knee up off the floor. Then slowly slide your foot back down until your leg is straight again.  Lying on your back spread your legs as far apart as you can without causing discomfort.  Lying on your side, raise your upper leg and foot straight up from the floor as far as is comfortable. Slowly lower the leg and repeat.  Lying on your back, tighten up the muscle in the front of your thigh (quadriceps muscles). You can do this by keeping your leg straight and trying to raise your heel off the floor. This helps strengthen the largest muscle supporting your knee.  Lying on your back, tighten up the muscles of your buttocks both with the legs straight and with the knee bent at a comfortable angle while keeping your heel on the floor.   SKILLED REHAB INSTRUCTIONS: If the patient is transferred to a skilled rehab facility following release from the hospital, a list of the current medications will be sent to the facility for the patient to continue.  When discharged from the skilled rehab facility, please have the facility set up the patient's Home Health Physical Therapy prior to being released. Also, the skilled facility will be responsible for providing the patient with their medications at time of release from the facility to include their pain medication and their blood thinner medication. If the patient is still at the rehab facility at time of the two week follow up appointment, the skilled rehab facility will also need to assist the patient in arranging follow up appointment in our office and any transportation needs.  POST-OPERATIVE OPIOID TAPER INSTRUCTIONS: It is important to wean off of your opioid medication as soon as possible. If you do not need pain medication after your surgery it is ok  to stop day one. Opioids include: Codeine, Hydrocodone(Norco, Vicodin), Oxycodone(Percocet, oxycontin) and hydromorphone amongst others.  Long term and even short term use of opiods can cause: Increased pain response Dependence Constipation Depression Respiratory depression And more.  Withdrawal symptoms can include Flu like symptoms Nausea, vomiting And more Techniques to manage these symptoms Hydrate well Eat regular healthy meals Stay active Use relaxation techniques(deep breathing, meditating, yoga) Do Not substitute Alcohol to help with tapering If you have been on opioids for less than two weeks and do not have pain than it is ok to stop all together.  Plan to wean off of opioids This plan should start within one week post op of your joint replacement. Maintain the same interval or time between taking each dose and first decrease the dose.  Cut the total daily intake of opioids by one tablet each day Next start to increase the time between doses. The last dose that should be eliminated is the evening dose.    MAKE   SURE YOU:  Understand these instructions.  Will watch your condition.  Will get help right away if you are not doing well or get worse.  Pick up stool softner and laxative for home use following surgery while on pain medications. Do not remove your dressing. The dressing is waterproof--it is OK to take showers. Continue to use ice for pain and swelling after surgery. Do not use any lotions or creams on the incision until instructed by your surgeon. Total Hip Protocol.   Information on my medicine - ELIQUIS (apixaban)  This medication education was reviewed with me or my healthcare representative as part of my discharge preparation.  The pharmacist that spoke with me during my hospital stay was:    Why was Eliquis prescribed for you? Eliquis was prescribed for you to reduce the risk of blood clots forming after orthopedic surgery.    What do You need  to know about Eliquis? Take your Eliquis TWICE DAILY - one tablet in the morning and one tablet in the evening with or without food.  It would be best to take the dose about the same time each day.  If you have difficulty swallowing the tablet whole please discuss with your pharmacist how to take the medication safely.  Take Eliquis exactly as prescribed by your doctor and DO NOT stop taking Eliquis without talking to the doctor who prescribed the medication.  Stopping without other medication to take the place of Eliquis may increase your risk of developing a clot.  After discharge, you should have regular check-up appointments with your healthcare provider that is prescribing your Eliquis.  What do you do if you miss a dose? If a dose of ELIQUIS is not taken at the scheduled time, take it as soon as possible on the same day and twice-daily administration should be resumed.  The dose should not be doubled to make up for a missed dose.  Do not take more than one tablet of ELIQUIS at the same time.  Important Safety Information A possible side effect of Eliquis is bleeding. You should call your healthcare provider right away if you experience any of the following: Bleeding from an injury or your nose that does not stop. Unusual colored urine (red or dark brown) or unusual colored stools (red or black). Unusual bruising for unknown reasons. A serious fall or if you hit your head (even if there is no bleeding).  Some medicines may interact with Eliquis and might increase your risk of bleeding or clotting while on Eliquis. To help avoid this, consult your healthcare provider or pharmacist prior to using any new prescription or non-prescription medications, including herbals, vitamins, non-steroidal anti-inflammatory drugs (NSAIDs) and supplements.  This website has more information on Eliquis (apixaban): http://www.eliquis.com/eliquis/home  

## 2022-11-13 NOTE — Anesthesia Preprocedure Evaluation (Addendum)
Anesthesia Evaluation  Patient identified by MRN, date of birth, ID band Patient awake    Reviewed: Allergy & Precautions, H&P , NPO status , Patient's Chart, lab work & pertinent test results  Airway Mallampati: II  TM Distance: >3 FB Neck ROM: Full    Dental no notable dental hx.    Pulmonary former smoker   Pulmonary exam normal breath sounds clear to auscultation       Cardiovascular + CAD, + Past MI and + Cardiac Stents  Normal cardiovascular exam Rhythm:Regular Rate:Normal     Neuro/Psych negative neurological ROS  negative psych ROS   GI/Hepatic negative GI ROS, Neg liver ROS,,,  Endo/Other  Hypothyroidism    Renal/GU negative Renal ROS  negative genitourinary   Musculoskeletal  (+) Arthritis , Osteoarthritis,    Abdominal   Peds negative pediatric ROS (+)  Hematology negative hematology ROS (+)   Anesthesia Other Findings   Reproductive/Obstetrics negative OB ROS                             Anesthesia Physical Anesthesia Plan  ASA: 3  Anesthesia Plan: General   Post-op Pain Management: Tylenol PO (pre-op)*   Induction: Intravenous  PONV Risk Score and Plan: 2 and Ondansetron, Dexamethasone, Propofol infusion and Treatment may vary due to age or medical condition  Airway Management Planned: Oral ETT  Additional Equipment:   Intra-op Plan:   Post-operative Plan: Extubation in OR  Informed Consent: I have reviewed the patients History and Physical, chart, labs and discussed the procedure including the risks, benefits and alternatives for the proposed anesthesia with the patient or authorized representative who has indicated his/her understanding and acceptance.     Dental advisory given  Plan Discussed with: CRNA and Surgeon  Anesthesia Plan Comments:        Anesthesia Quick Evaluation

## 2022-11-13 NOTE — Interval H&P Note (Signed)
History and Physical Interval Note:  11/13/2022 8:17 AM  Melanie Dudley  has presented today for surgery, with the diagnosis of Right hip osteoarthritis.  The various methods of treatment have been discussed with the patient and family. After consideration of risks, benefits and other options for treatment, the patient has consented to  Procedure(s) with comments: [place] (Right) - 120 as a surgical intervention.  The patient's history has been reviewed, patient examined, no change in status, stable for surgery.  I have reviewed the patient's chart and labs.  Questions were answered to the patient's satisfaction.    The risks, benefits, and alternatives were discussed with the patient. There are risks associated with the surgery including, but not limited to, problems with anesthesia (death), infection, instability (giving out of the joint), dislocation, differences in leg length/angulation/rotation, fracture of bones, loosening or failure of implants, hematoma (blood accumulation) which may require surgical drainage, blood clots, pulmonary embolism, nerve injury (foot drop and lateral thigh numbness), and blood vessel injury. The patient understands these risks and elects to proceed.    Hilton Cork Jamarea Selner

## 2022-11-13 NOTE — Op Note (Signed)
OPERATIVE REPORT  SURGEON: Rod Can, MD   ASSISTANT: Larene Pickett, PA-C.  PREOPERATIVE DIAGNOSIS: Right hip arthritis.   POSTOPERATIVE DIAGNOSIS: Right hip arthritis.   PROCEDURE: Right total hip arthroplasty, anterior approach.   IMPLANTS: Biomet Taperloc Complete Microplasty stem, size 12 x 109 mm, high offset. Biomet G7 OsseoTi Cup, size 56 mm. Biomet Vivacit-E liner, size 36 mm, F, +5 mm neutral. Biomet Biolox ceramic head ball, size 36 - 6 mm.  ANESTHESIA:  General  ESTIMATED BLOOD LOSS: 200 mL.  ANTIBIOTICS: 2 g Ancef.  DRAINS: None.  COMPLICATIONS: None.   CONDITION: PACU - hemodynamically stable.   BRIEF CLINICAL NOTE: Melanie Dudley is a 52 y.o. female with a long-standing history of Right hip arthritis. After failing conservative management, the patient was indicated for total hip arthroplasty. The risks, benefits, and alternatives to the procedure were explained, and the patient elected to proceed.  PROCEDURE IN DETAIL: Surgical site was marked by myself in the pre-op holding area. Once inside the operating room, spinal anesthesia was obtained, and a foley catheter was inserted. The patient was then positioned on the Hana table.  All bony prominences were well padded.  The hip was prepped and draped in the normal sterile surgical fashion.  A time-out was called verifying side and site of surgery. The patient received IV antibiotics within 60 minutes of beginning the procedure.   Bikini incision was made, and superficial dissection was performed lateral to the ASIS. The direct anterior approach to the hip was performed through the Hueter interval.  Lateral femoral circumflex vessels were treated with the Auqumantys. The anterior capsule was exposed and an inverted T capsulotomy was made. The femoral neck cut was made to the level of the templated cut.  A corkscrew was placed into the head and the head was removed.  The femoral head was found to have eburnated  bone. The head was passed to the back table and was measured. Pubofemoral ligament was released off of the calcar, taking care to stay on bone. Superior capsule was released from the greater trochanter, taking care to stay lateral to the posterior border of the femoral neck in order to preserve the short external rotators.   Acetabular exposure was achieved, and the pulvinar and labrum were excised. Sequential reaming of the acetabulum was then performed up to a size 55 mm reamer. A 56 mm cup was then opened and impacted into place at approximately 40 degrees of abduction and 20 degrees of anteversion. The final polyethylene liner was impacted into place and acetabular osteophytes were removed.    I then gained femoral exposure taking care to protect the abductors and greater trochanter.  This was performed using standard external rotation, extension, and adduction.  A cookie cutter was used to enter the femoral canal, and then the femoral canal finder was placed.  Sequential broaching was performed up to a size 12.  Calcar planer was used on the femoral neck remnant.  I placed a high offset neck and a trial head ball.  The hip was reduced.  Leg lengths and offset were checked fluoroscopically.  The hip was dislocated and trial components were removed.  The final implants were placed, and the hip was reduced.  Fluoroscopy was used to confirm component position and leg lengths.  At 90 degrees of external rotation and full extension, the hip was stable to an anterior directed force.   The wound was copiously irrigated with Prontosan solution and normal saline using pulse lavage.  Marcaine  solution was injected into the periarticular soft tissue.  The wound was closed in layers using #1 Vicryl and V-Loc for the fascia, 2-0 Vicryl for the subcutaneous fat, 2-0 Monocryl for the deep dermal layer, 3-0 running Monocryl subcuticular stitch, and Dermabond for the skin.  Once the glue was fully dried, an Aquacell Ag  dressing was applied.  The patient was transported to the recovery room in stable condition.  Sponge, needle, and instrument counts were correct at the end of the case x2.  The patient tolerated the procedure well and there were no known complications.  Please note that a surgical assistant was a medical necessity for this procedure to perform it in a safe and expeditious manner. Assistant was necessary to provide appropriate retraction of vital neurovascular structures, to prevent femoral fracture, and to allow for anatomic placement of the prosthesis.
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
## 2022-11-14 ENCOUNTER — Encounter (HOSPITAL_COMMUNITY): Payer: Self-pay | Admitting: Orthopedic Surgery

## 2022-11-14 DIAGNOSIS — M1611 Unilateral primary osteoarthritis, right hip: Secondary | ICD-10-CM | POA: Diagnosis not present

## 2022-11-14 LAB — CBC
HCT: 29.8 % — ABNORMAL LOW (ref 36.0–46.0)
Hemoglobin: 9.8 g/dL — ABNORMAL LOW (ref 12.0–15.0)
MCH: 30 pg (ref 26.0–34.0)
MCHC: 32.9 g/dL (ref 30.0–36.0)
MCV: 91.1 fL (ref 80.0–100.0)
Platelets: 289 10*3/uL (ref 150–400)
RBC: 3.27 MIL/uL — ABNORMAL LOW (ref 3.87–5.11)
RDW: 13.6 % (ref 11.5–15.5)
WBC: 14.1 10*3/uL — ABNORMAL HIGH (ref 4.0–10.5)
nRBC: 0 % (ref 0.0–0.2)

## 2022-11-14 LAB — BASIC METABOLIC PANEL
Anion gap: 8 (ref 5–15)
BUN: 11 mg/dL (ref 6–20)
CO2: 23 mmol/L (ref 22–32)
Calcium: 8.3 mg/dL — ABNORMAL LOW (ref 8.9–10.3)
Chloride: 108 mmol/L (ref 98–111)
Creatinine, Ser: 0.71 mg/dL (ref 0.44–1.00)
GFR, Estimated: >60 mL/min (ref >60)
Glucose, Bld: 135 mg/dL — ABNORMAL HIGH (ref 70–99)
Potassium: 3.7 mmol/L (ref 3.5–5.1)
Sodium: 139 mmol/L (ref 135–145)

## 2022-11-14 MED ORDER — SODIUM CHLORIDE 0.9 % IV BOLUS
500.0000 mL | Freq: Once | INTRAVENOUS | Status: AC
  Administered 2022-11-14: 500 mL via INTRAVENOUS

## 2022-11-14 MED ORDER — POLYETHYLENE GLYCOL 3350 17 G PO PACK: 17.0000 g | PACK | Freq: Every day | ORAL | 0 refills | Status: AC | PRN

## 2022-11-14 MED ORDER — APIXABAN 2.5 MG PO TABS: 2.5000 mg | ORAL_TABLET | Freq: Two times a day (BID) | ORAL | 0 refills | Status: AC

## 2022-11-14 MED ORDER — ONDANSETRON HCL 4 MG PO TABS: 4.0000 mg | ORAL_TABLET | Freq: Three times a day (TID) | ORAL | 0 refills | Status: AC | PRN

## 2022-11-14 MED ORDER — SENNA 8.6 MG PO TABS: 2.0000 | ORAL_TABLET | Freq: Every day | ORAL | 0 refills | Status: AC

## 2022-11-14 MED ORDER — DOCUSATE SODIUM 100 MG PO CAPS: 100.0000 mg | ORAL_CAPSULE | Freq: Two times a day (BID) | ORAL | 0 refills | Status: AC

## 2022-11-14 MED ORDER — HYDROCODONE-ACETAMINOPHEN 5-325 MG PO TABS: 1.0000 | ORAL_TABLET | ORAL | 0 refills | Status: AC | PRN
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
## 2022-11-14 NOTE — Plan of Care (Signed)
  Problem: Pain Management: Goal: Pain level will decrease with appropriate interventions Outcome: Progressing   Problem: Coping: Goal: Level of anxiety will decrease Outcome: Progressing   

## 2022-11-14 NOTE — Progress Notes (Signed)
    Subjective:  Patient reports pain as mild.  Denies N/V/CP/SOB/Abd pain. She denies any tingling or numbness in LE bilaterally. Mild thigh pain this morning but well controlled.   Objective:   VITALS:   Vitals:   11/13/22 1705 11/13/22 2123 11/14/22 0132 11/14/22 0611  BP: 122/72 105/72 (!) 94/56 98/65  Pulse: 89 86 77 82  Resp: 18 16 15 16   Temp: 97.7 F (36.5 C) 98 F (36.7 C) 98 F (36.7 C) (!) 97.5 F (36.4 C)  TempSrc: Oral Oral Oral   SpO2: 97% 98% 94% 96%  Weight:      Height:        Patient sitting up in bed. NAD.  Neurologically intact ABD soft Neurovascular intact Sensation intact distally Intact pulses distally Dorsiflexion/Plantar flexion intact Incision: dressing C/D/I No cellulitis present Compartment soft   Lab Results  Component Value Date   WBC 14.1 (H) 11/14/2022   HGB 9.8 (L) 11/14/2022   HCT 29.8 (L) 11/14/2022   MCV 91.1 11/14/2022   PLT 289 11/14/2022   BMET    Component Value Date/Time   NA 139 11/14/2022 0340   K 3.7 11/14/2022 0340   CL 108 11/14/2022 0340   CO2 23 11/14/2022 0340   GLUCOSE 135 (H) 11/14/2022 0340   BUN 11 11/14/2022 0340   CREATININE 0.71 11/14/2022 0340   CALCIUM 8.3 (L) 11/14/2022 0340   GFRNONAA >60 11/14/2022 0340     Assessment/Plan: 1 Day Post-Op   Principal Problem:   Osteoarthritis of right hip  ABLA. Hemoglobin 9.8. Asymptomatic. Continue to monitor.   WBAT with walker DVT ppx:  Eliquis and plavix , SCDs, TEDS PO pain control PT/OT: patient ambulated 62 feet with PT yesterday. Continue PT today.  Dispo: D/c home with HEP once cleared with PT. Medication has been sent to pharmacy. Patient to follow-up in office in 2 weeks.     Charlott Rakes, PA-C 11/14/2022, 7:21 AM   [facility]  [place] [address], [place], [place] [address] Phone: [phone] [email or web address] Facebook  [employer]

## 2022-11-14 NOTE — TOC Transition Note (Signed)
Transition of Care [facility]) - CM/SW Discharge Note   Patient Details  Name: Melanie Dudley MRN: [id number] Date of Birth: Apr 05, 1971  [facility] CM/SW Contact:  Lennart Pall, LCSW Phone Number: 11/14/2022, 11:26 AM   Clinical Narrative:     Met with pt who confirms she already has needed DME at home.  Plan for HEP.  No TOC needs.  Final next level of care: Home/Self Care Barriers to Discharge: No Barriers Identified   Patient Goals and CMS Choice      Discharge Placement                         Discharge Plan and Services Additional resources added to the After Visit Summary for                  DME Arranged: N/A DME Agency: NA                  Social Determinants of Health (SDOH) Interventions SDOH Screenings   Food Insecurity: No Food Insecurity (11/13/2022)  Housing: Low Risk  (11/13/2022)  Transportation Needs: No Transportation Needs (11/13/2022)  Utilities: Not At Risk (11/13/2022)  Tobacco Use: Medium Risk (11/13/2022)     Readmission Risk Interventions     No data to display

## 2022-11-14 NOTE — Anesthesia Postprocedure Evaluation (Signed)
Anesthesia Post Note  Patient: Teaching laboratory technician  Procedure(s) Performed: TOTAL HIP ARTHROPLASTY ANTERIOR APPROACH (Right: Hip)     Patient location during evaluation: PACU Anesthesia Type: General Level of consciousness: awake and alert Pain management: pain level controlled Vital Signs Assessment: post-procedure vital signs reviewed and stable Respiratory status: spontaneous breathing, nonlabored ventilation, respiratory function stable and patient connected to nasal cannula oxygen Cardiovascular status: blood pressure returned to baseline and stable Postop Assessment: no apparent nausea or vomiting Anesthetic complications: no  No notable events documented.  Last Vitals:  Vitals:   11/14/22 0936 11/14/22 1254  BP: (!) 85/61 100/62  Pulse: 80 71  Resp: 18   Temp: (!) 36.4 C   SpO2: 96% 100%    Last Pain:  Vitals:   11/14/22 0936  TempSrc: Oral  PainSc:                  Fitzhugh Vizcarrondo S

## 2022-11-14 NOTE — Progress Notes (Signed)
Patient discharged to home w/ family. Given all belongings, instructions. Verbalized understanding of instructions. Escorted to pov via w/c. 

## 2022-12-11 ENCOUNTER — Other Ambulatory Visit: Payer: Self-pay

## 2022-12-11 DIAGNOSIS — Z95828 Presence of other vascular implants and grafts: Secondary | ICD-10-CM

## 2023-02-04 ENCOUNTER — Telehealth: Payer: Self-pay

## 2023-02-04 NOTE — Telephone Encounter (Signed)
Pt called asking if it was ok to get Tremfya injection the day after her IVC filter removal.  Reviewed chart, spoke to [facility], [place], who didn't see any reason to hold it.   Called pt, two identifiers used. Relayed PA's advice. Confirmed understanding.

## 2023-02-10 ENCOUNTER — Ambulatory Visit (HOSPITAL_COMMUNITY)
Admission: RE | Disposition: A | Discharge status: Home / Self Care | Payer: Self-pay | Source: Home / Self Care | Attending: Vascular Surgery

## 2023-02-10 ENCOUNTER — Ambulatory Visit (HOSPITAL_COMMUNITY)
Admission: RE | Admit: 2023-02-10 | Discharge: 2023-02-10 | Disposition: A | Discharge status: Home / Self Care | Payer: Medicare HMO | Attending: Vascular Surgery | Admitting: Vascular Surgery

## 2023-02-10 DIAGNOSIS — Z96641 Presence of right artificial hip joint: Secondary | ICD-10-CM | POA: Diagnosis not present

## 2023-02-10 DIAGNOSIS — M1611 Unilateral primary osteoarthritis, right hip: Secondary | ICD-10-CM | POA: Diagnosis not present

## 2023-02-10 DIAGNOSIS — Z86718 Personal history of other venous thrombosis and embolism: Secondary | ICD-10-CM

## 2023-02-10 DIAGNOSIS — Z4589 Encounter for adjustment and management of other implanted devices: Secondary | ICD-10-CM | POA: Diagnosis present

## 2023-02-10 DIAGNOSIS — Z95828 Presence of other vascular implants and grafts: Secondary | ICD-10-CM

## 2023-02-10 DIAGNOSIS — Z86711 Personal history of pulmonary embolism: Secondary | ICD-10-CM | POA: Diagnosis not present

## 2023-02-10 HISTORY — PX: IVC FILTER REMOVAL: CATH118246

## 2023-02-10 LAB — POCT I-STAT, CHEM 8
BUN: 21 mg/dL — ABNORMAL HIGH (ref 6–20)
Calcium, Ion: 1.42 mmol/L — ABNORMAL HIGH (ref 1.15–1.40)
Chloride: 110 mmol/L (ref 98–111)
Creatinine, Ser: 0.8 mg/dL (ref 0.44–1.00)
Glucose, Bld: 109 mg/dL — ABNORMAL HIGH (ref 70–99)
HCT: 38 % (ref 36.0–46.0)
Hemoglobin: 12.9 g/dL (ref 12.0–15.0)
Potassium: 3.7 mmol/L (ref 3.5–5.1)
Sodium: 146 mmol/L — ABNORMAL HIGH (ref 135–145)
TCO2: 26 mmol/L (ref 22–32)

## 2023-02-10 SURGERY — IVC FILTER REMOVAL
Anesthesia: LOCAL

## 2023-02-10 MED ORDER — LIDOCAINE HCL (PF) 1 % IJ SOLN
INTRAMUSCULAR | Status: AC
  Filled 2023-02-10: qty 30

## 2023-02-10 MED ORDER — FENTANYL CITRATE (PF) 100 MCG/2ML IJ SOLN
INTRAMUSCULAR | Status: AC
  Filled 2023-02-10: qty 2

## 2023-02-10 MED ORDER — MIDAZOLAM HCL 2 MG/2ML IJ SOLN
INTRAMUSCULAR | Status: DC | PRN
  Administered 2023-02-10 (×2): 1 mg via INTRAVENOUS

## 2023-02-10 MED ORDER — LIDOCAINE HCL (PF) 1 % IJ SOLN
INTRAMUSCULAR | Status: DC | PRN
  Administered 2023-02-10: 10 mL

## 2023-02-10 MED ORDER — MIDAZOLAM HCL 2 MG/2ML IJ SOLN
INTRAMUSCULAR | Status: AC
  Filled 2023-02-10: qty 2

## 2023-02-10 MED ORDER — SODIUM CHLORIDE 0.9 % IV SOLN: INTRAVENOUS | Status: DC

## 2023-02-10 MED ORDER — IODIXANOL 320 MG/ML IV SOLN
INTRAVENOUS | Status: DC | PRN
  Administered 2023-02-10: 5 mL

## 2023-02-10 MED ORDER — FENTANYL CITRATE (PF) 100 MCG/2ML IJ SOLN
INTRAMUSCULAR | Status: DC | PRN
  Administered 2023-02-10 (×2): 50 ug via INTRAVENOUS

## 2023-02-10 SURGICAL SUPPLY — 8 items
KIT MICROPUNCTURE NIT STIFF (SHEATH) IMPLANT
KIT PV (KITS) ×1 IMPLANT
SET CLOVERSNARE FLT RETRIEVAL (MISCELLANEOUS) IMPLANT
SHEATH PROBE COVER 6X72 (BAG) IMPLANT
SYR MEDRAD MARK 7 150ML (SYRINGE) ×1 IMPLANT
TRANSDUCER W/STOPCOCK (MISCELLANEOUS) ×1 IMPLANT
TRAY PV CATH (CUSTOM PROCEDURE TRAY) ×1 IMPLANT
WIRE STARTER BENTSON 035X150 (WIRE) IMPLANT
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
## 2023-02-10 NOTE — Op Note (Signed)
    Patient name: Melanie Dudley MRN: [id number] DOB: [birth year] Sex: female  02/10/2023 Pre-operative Diagnosis: Presence of IVC filter Post-operative diagnosis:  Same Surgeon:  Luanna Salk. Randie Heinz, MD Procedure Performed: 1.  Ultrasound-guided cannulation right internal jugular vein 2.  Removal IVC filter  3.  IVC venogram 4.  Moderate sedation with fentanyl and Versed for 22 minutes   Indications: 52 year old female with history of IVC filter placement for total hip arthroplasty.  She has now recovered well and is indicated for removal.  Findings: Filter was removed intact and the IVC was patent without extravasation at completion.   Procedure:  The patient was identified in the holding area and taken to room 8.  The patient was then placed supine on the table and prepped and draped in the usual sterile fashion.  A time out was called.  Ultrasound was used to evaluate the right internal jugular vein.  The vein was noted to be patent compressible and the area was anesthetized 1% lidocaine and cannulated with a micropuncture needle, wire and a sheath.  Moderate sedation was administered with fentanyl and Versed and her vital signs were monitored throughout the case.  I then placed a Bentson wire fluoroscopically into the IVC and then placed triple sheath fluoroscopically over the wire.  The filter was snared and the sheath was advanced over this filter was removed and noted to be in 1 piece all components were attached.  Central venogram was performed which demonstrated no active extravasation.  Sheath was removed and pressure held to hemostasis obtained.  She tolerated procedure without any complication.  Contrast: 5 cc  Becket Wecker C. Randie Heinz, MD [facility] [place] Office: [phone] Pager: [phone]

## 2023-02-11 ENCOUNTER — Encounter (HOSPITAL_COMMUNITY): Payer: Self-pay | Admitting: Vascular Surgery

## 2023-02-11 MED FILL — Lidocaine HCl Local Preservative Free (PF) Inj 1%: INTRAMUSCULAR | Qty: 30 | Status: AC
